# Patient Record
Sex: Female | Born: 1935
Health system: Southern US, Community
[De-identification: ages and names within clinical notes are randomized; demographics above are authoritative.]

## PROBLEM LIST (undated history)

## (undated) DIAGNOSIS — G459 Transient cerebral ischemic attack, unspecified: Secondary | ICD-10-CM

## (undated) DIAGNOSIS — E7800 Pure hypercholesterolemia, unspecified: Secondary | ICD-10-CM

## (undated) DIAGNOSIS — G309 Alzheimer's disease, unspecified: Secondary | ICD-10-CM

## (undated) DIAGNOSIS — F0280 Dementia in other diseases classified elsewhere without behavioral disturbance: Secondary | ICD-10-CM

## (undated) HISTORY — PX: KNEE SURGERY: SHX244

## (undated) HISTORY — PX: APPENDECTOMY: SHX54

---

## 2004-09-24 ENCOUNTER — Emergency Department: Payer: Self-pay | Admitting: Internal Medicine

## 2004-09-24 ENCOUNTER — Other Ambulatory Visit: Payer: Self-pay

## 2004-09-26 ENCOUNTER — Other Ambulatory Visit: Payer: Self-pay

## 2004-09-26 ENCOUNTER — Inpatient Hospital Stay: Payer: Self-pay | Admitting: Internal Medicine

## 2004-10-04 ENCOUNTER — Encounter: Payer: Self-pay | Admitting: Internal Medicine

## 2004-10-19 ENCOUNTER — Encounter: Payer: Self-pay | Admitting: Internal Medicine

## 2004-12-07 ENCOUNTER — Ambulatory Visit: Payer: Self-pay | Admitting: Unknown Physician Specialty

## 2005-03-10 ENCOUNTER — Ambulatory Visit: Payer: Self-pay | Admitting: Internal Medicine

## 2005-08-16 ENCOUNTER — Emergency Department: Payer: Self-pay | Admitting: Unknown Physician Specialty

## 2005-08-16 ENCOUNTER — Other Ambulatory Visit: Payer: Self-pay

## 2006-04-04 ENCOUNTER — Ambulatory Visit: Payer: Self-pay | Admitting: Internal Medicine

## 2006-04-20 ENCOUNTER — Inpatient Hospital Stay: Payer: Self-pay | Admitting: Internal Medicine

## 2006-04-20 ENCOUNTER — Other Ambulatory Visit: Payer: Self-pay

## 2006-05-23 ENCOUNTER — Ambulatory Visit: Payer: Self-pay | Admitting: Unknown Physician Specialty

## 2006-06-07 ENCOUNTER — Ambulatory Visit: Payer: Self-pay | Admitting: Unknown Physician Specialty

## 2006-07-12 ENCOUNTER — Ambulatory Visit: Payer: Self-pay | Admitting: Cardiology

## 2006-09-25 ENCOUNTER — Ambulatory Visit: Payer: Self-pay | Admitting: Family Medicine

## 2006-12-01 ENCOUNTER — Inpatient Hospital Stay: Payer: Self-pay | Admitting: Internal Medicine

## 2006-12-01 ENCOUNTER — Other Ambulatory Visit: Payer: Self-pay

## 2007-03-22 ENCOUNTER — Ambulatory Visit: Payer: Self-pay | Admitting: Internal Medicine

## 2008-03-25 ENCOUNTER — Ambulatory Visit: Payer: Self-pay | Admitting: Internal Medicine

## 2008-06-03 ENCOUNTER — Ambulatory Visit: Payer: Self-pay | Admitting: Unknown Physician Specialty

## 2008-12-14 ENCOUNTER — Emergency Department: Payer: Self-pay | Admitting: Emergency Medicine

## 2009-04-01 ENCOUNTER — Ambulatory Visit: Payer: Self-pay | Admitting: Internal Medicine

## 2009-05-09 ENCOUNTER — Emergency Department: Payer: Self-pay | Admitting: Emergency Medicine

## 2010-04-05 ENCOUNTER — Ambulatory Visit: Payer: Self-pay | Admitting: Internal Medicine

## 2010-05-04 ENCOUNTER — Observation Stay: Payer: Self-pay | Admitting: Specialist

## 2010-10-06 ENCOUNTER — Ambulatory Visit: Payer: Self-pay | Admitting: Internal Medicine

## 2011-04-11 ENCOUNTER — Ambulatory Visit: Payer: Self-pay | Admitting: Internal Medicine

## 2011-12-08 ENCOUNTER — Emergency Department: Payer: Self-pay | Admitting: *Deleted

## 2012-03-03 LAB — COMPREHENSIVE METABOLIC PANEL
Albumin: 3.7 g/dL (ref 3.4–5.0)
Alkaline Phosphatase: 28 U/L — ABNORMAL LOW (ref 50–136)
Anion Gap: 14 (ref 7–16)
BUN: 17 mg/dL (ref 7–18)
Bilirubin,Total: 0.9 mg/dL (ref 0.2–1.0)
Calcium, Total: 8.9 mg/dL (ref 8.5–10.1)
Creatinine: 1.04 mg/dL (ref 0.60–1.30)
EGFR (Non-African Amer.): 54 — ABNORMAL LOW
Glucose: 128 mg/dL — ABNORMAL HIGH (ref 65–99)
Osmolality: 286 (ref 275–301)
Potassium: 3.7 mmol/L (ref 3.5–5.1)
SGPT (ALT): 18 U/L
Sodium: 142 mmol/L (ref 136–145)
Total Protein: 6.6 g/dL (ref 6.4–8.2)

## 2012-03-03 LAB — CK TOTAL AND CKMB (NOT AT ARMC)
CK, Total: 122 U/L (ref 21–215)
CK-MB: 2.9 ng/mL (ref 0.5–3.6)

## 2012-03-03 LAB — CBC
HCT: 37.4 % (ref 35.0–47.0)
HGB: 12.8 g/dL (ref 12.0–16.0)
MCH: 31 pg (ref 26.0–34.0)
MCHC: 34.2 g/dL (ref 32.0–36.0)
Platelet: 149 10*3/uL — ABNORMAL LOW (ref 150–440)
RBC: 4.12 10*6/uL (ref 3.80–5.20)
RDW: 12.7 % (ref 11.5–14.5)

## 2012-03-03 LAB — TROPONIN I: Troponin-I: <0.02 ng/mL

## 2012-03-04 ENCOUNTER — Observation Stay: Payer: Self-pay | Admitting: Specialist

## 2012-03-04 LAB — CK-MB
CK-MB: 2.1 ng/mL (ref 0.5–3.6)
CK-MB: 2.6 ng/mL (ref 0.5–3.6)

## 2012-03-04 LAB — BASIC METABOLIC PANEL
Anion Gap: 10 (ref 7–16)
BUN: 14 mg/dL (ref 7–18)
Calcium, Total: 8.5 mg/dL (ref 8.5–10.1)
Chloride: 106 mmol/L (ref 98–107)
Co2: 27 mmol/L (ref 21–32)
Osmolality: 287 (ref 275–301)
Potassium: 3.8 mmol/L (ref 3.5–5.1)

## 2012-03-04 LAB — CBC WITH DIFFERENTIAL/PLATELET
Basophil #: 0 10*3/uL (ref 0.0–0.1)
Eosinophil %: 0.1 %
Lymphocyte %: 14.4 %
MCH: 31 pg (ref 26.0–34.0)
MCV: 90 fL (ref 80–100)
Monocyte %: 5 %
Platelet: 149 10*3/uL — ABNORMAL LOW (ref 150–440)
RBC: 3.91 10*6/uL (ref 3.80–5.20)
RDW: 12.6 % (ref 11.5–14.5)
WBC: 4.7 10*3/uL (ref 3.6–11.0)

## 2012-03-04 LAB — TROPONIN I: Troponin-I: <0.02 ng/mL

## 2012-03-09 LAB — CULTURE, BLOOD (SINGLE)

## 2012-05-09 ENCOUNTER — Ambulatory Visit: Payer: Self-pay | Admitting: Internal Medicine

## 2012-09-24 ENCOUNTER — Emergency Department: Payer: Self-pay | Admitting: Emergency Medicine

## 2012-09-24 ENCOUNTER — Ambulatory Visit: Payer: Self-pay | Admitting: Internal Medicine

## 2012-09-24 LAB — BASIC METABOLIC PANEL
Anion Gap: 9 (ref 7–16)
BUN: 13 mg/dL (ref 7–18)
Co2: 25 mmol/L (ref 21–32)
Creatinine: 0.98 mg/dL (ref 0.60–1.30)
EGFR (African American): >60
EGFR (Non-African Amer.): 54 — ABNORMAL LOW
Glucose: 123 mg/dL — ABNORMAL HIGH (ref 65–99)
Potassium: 4.2 mmol/L (ref 3.5–5.1)
Sodium: 137 mmol/L (ref 136–145)

## 2012-09-24 LAB — CBC WITH DIFFERENTIAL/PLATELET
Eosinophil #: 0 10*3/uL (ref 0.0–0.7)
Lymphocyte #: 0.6 10*3/uL — ABNORMAL LOW (ref 1.0–3.6)
MCH: 30.4 pg (ref 26.0–34.0)
MCHC: 34.7 g/dL (ref 32.0–36.0)
MCV: 88 fL (ref 80–100)
Monocyte #: 1.1 x10 3/mm — ABNORMAL HIGH (ref 0.2–0.9)
Neutrophil %: 81.5 %
Platelet: 193 10*3/uL (ref 150–440)
RBC: 3.84 10*6/uL (ref 3.80–5.20)

## 2012-09-24 LAB — PROTIME-INR: Prothrombin Time: 14.2 secs (ref 11.5–14.7)

## 2013-01-18 ENCOUNTER — Ambulatory Visit: Payer: Self-pay | Admitting: Internal Medicine

## 2013-11-20 ENCOUNTER — Ambulatory Visit: Payer: Self-pay | Admitting: Family Medicine

## 2014-04-05 ENCOUNTER — Emergency Department: Payer: Self-pay | Admitting: Emergency Medicine

## 2014-04-05 LAB — DIFFERENTIAL
BASOS PCT: 0.5 %
Basophil #: 0 10*3/uL (ref 0.0–0.1)
EOS PCT: 0.1 %
Eosinophil #: 0 10*3/uL (ref 0.0–0.7)
LYMPHS ABS: 0.8 10*3/uL — AB (ref 1.0–3.6)
Lymphocyte %: 31.5 %
Monocyte #: 0.2 x10 3/mm (ref 0.2–0.9)
Monocyte %: 9.7 %
NEUTROS ABS: 1.5 10*3/uL (ref 1.4–6.5)
NEUTROS PCT: 58.2 %

## 2014-04-05 LAB — BASIC METABOLIC PANEL
Anion Gap: 6 — ABNORMAL LOW (ref 7–16)
BUN: 7 mg/dL (ref 7–18)
CALCIUM: 8.9 mg/dL (ref 8.5–10.1)
CO2: 30 mmol/L (ref 21–32)
CREATININE: 0.89 mg/dL (ref 0.60–1.30)
Chloride: 107 mmol/L (ref 98–107)
EGFR (African American): >60
EGFR (Non-African Amer.): >60
Glucose: 91 mg/dL (ref 65–99)
Osmolality: 283 (ref 275–301)
Potassium: 3.7 mmol/L (ref 3.5–5.1)
Sodium: 143 mmol/L (ref 136–145)

## 2014-04-05 LAB — CBC
HCT: 38.7 % (ref 35.0–47.0)
HGB: 12.8 g/dL (ref 12.0–16.0)
MCH: 29.3 pg (ref 26.0–34.0)
MCHC: 33.1 g/dL (ref 32.0–36.0)
MCV: 89 fL (ref 80–100)
PLATELETS: 154 10*3/uL (ref 150–440)
RBC: 4.37 10*6/uL (ref 3.80–5.20)
RDW: 13.4 % (ref 11.5–14.5)
WBC: 2.6 10*3/uL — ABNORMAL LOW (ref 3.6–11.0)

## 2014-04-05 LAB — PRO B NATRIURETIC PEPTIDE: B-TYPE NATIURETIC PEPTID: 181 pg/mL (ref 0–450)

## 2014-04-05 LAB — TROPONIN I: Troponin-I: <0.02 ng/mL

## 2014-07-09 ENCOUNTER — Observation Stay: Payer: Self-pay | Admitting: Internal Medicine

## 2014-07-09 LAB — CBC WITH DIFFERENTIAL/PLATELET
BASOS ABS: 0 10*3/uL (ref 0.0–0.1)
BASOS PCT: 0.5 %
EOS ABS: 0 10*3/uL (ref 0.0–0.7)
Eosinophil %: 0.2 %
HCT: 38.4 % (ref 35.0–47.0)
HGB: 12.8 g/dL (ref 12.0–16.0)
Lymphocyte #: 1 10*3/uL (ref 1.0–3.6)
Lymphocyte %: 13.5 %
MCH: 30.2 pg (ref 26.0–34.0)
MCHC: 33.4 g/dL (ref 32.0–36.0)
MCV: 90 fL (ref 80–100)
Monocyte #: 0.4 x10 3/mm (ref 0.2–0.9)
Monocyte %: 5.7 %
NEUTROS ABS: 5.8 10*3/uL (ref 1.4–6.5)
NEUTROS PCT: 80.1 %
Platelet: 179 10*3/uL (ref 150–440)
RBC: 4.25 10*6/uL (ref 3.80–5.20)
RDW: 13.7 % (ref 11.5–14.5)
WBC: 7.3 10*3/uL (ref 3.6–11.0)

## 2014-07-09 LAB — TROPONIN I: Troponin-I: <0.02 ng/mL

## 2014-07-09 LAB — CK-MB
CK-MB: 2 ng/mL (ref 0.5–3.6)
CK-MB: 2 ng/mL (ref 0.5–3.6)
CK-MB: 2.2 ng/mL (ref 0.5–3.6)

## 2014-07-09 LAB — BASIC METABOLIC PANEL
Anion Gap: 8 (ref 7–16)
BUN: 18 mg/dL (ref 7–18)
CALCIUM: 9.1 mg/dL (ref 8.5–10.1)
CO2: 25 mmol/L (ref 21–32)
CREATININE: 1.15 mg/dL (ref 0.60–1.30)
Chloride: 108 mmol/L — ABNORMAL HIGH (ref 98–107)
GFR CALC AF AMER: 51 — AB
GFR CALC NON AF AMER: 44 — AB
Glucose: 127 mg/dL — ABNORMAL HIGH (ref 65–99)
Osmolality: 285 (ref 275–301)
POTASSIUM: 4.2 mmol/L (ref 3.5–5.1)
Sodium: 141 mmol/L (ref 136–145)

## 2014-07-09 LAB — PROTIME-INR
INR: 1
Prothrombin Time: 13 secs (ref 11.5–14.7)

## 2014-07-09 LAB — APTT: Activated PTT: 29.4 secs (ref 23.6–35.9)

## 2014-07-10 DIAGNOSIS — I369 Nonrheumatic tricuspid valve disorder, unspecified: Secondary | ICD-10-CM

## 2014-07-10 LAB — CBC WITH DIFFERENTIAL/PLATELET
Basophil #: 0 10*3/uL (ref 0.0–0.1)
Basophil %: 0.1 %
Eosinophil #: 0 10*3/uL (ref 0.0–0.7)
Eosinophil %: 0.3 %
HCT: 34.7 % — ABNORMAL LOW (ref 35.0–47.0)
HGB: 11.7 g/dL — AB (ref 12.0–16.0)
Lymphocyte #: 0.7 10*3/uL — ABNORMAL LOW (ref 1.0–3.6)
Lymphocyte %: 9.3 %
MCH: 30.7 pg (ref 26.0–34.0)
MCHC: 33.7 g/dL (ref 32.0–36.0)
MCV: 91 fL (ref 80–100)
Monocyte #: 0.7 x10 3/mm (ref 0.2–0.9)
Monocyte %: 10.1 %
NEUTROS PCT: 80.2 %
Neutrophil #: 5.6 10*3/uL (ref 1.4–6.5)
Platelet: 149 10*3/uL — ABNORMAL LOW (ref 150–440)
RBC: 3.81 10*6/uL (ref 3.80–5.20)
RDW: 13.8 % (ref 11.5–14.5)
WBC: 7 10*3/uL (ref 3.6–11.0)

## 2014-07-10 LAB — BASIC METABOLIC PANEL
ANION GAP: 8 (ref 7–16)
BUN: 12 mg/dL (ref 7–18)
CALCIUM: 8.2 mg/dL — AB (ref 8.5–10.1)
CHLORIDE: 110 mmol/L — AB (ref 98–107)
Co2: 23 mmol/L (ref 21–32)
Creatinine: 0.8 mg/dL (ref 0.60–1.30)
EGFR (African American): >60
Glucose: 128 mg/dL — ABNORMAL HIGH (ref 65–99)
Osmolality: 283 (ref 275–301)
POTASSIUM: 3.2 mmol/L — AB (ref 3.5–5.1)
SODIUM: 141 mmol/L (ref 136–145)

## 2014-07-11 LAB — CBC WITH DIFFERENTIAL/PLATELET
Basophil #: 0 10*3/uL (ref 0.0–0.1)
Basophil %: 0.3 %
EOS ABS: 0 10*3/uL (ref 0.0–0.7)
EOS PCT: 0.1 %
HCT: 31.6 % — ABNORMAL LOW (ref 35.0–47.0)
HGB: 10.7 g/dL — AB (ref 12.0–16.0)
Lymphocyte #: 0.8 10*3/uL — ABNORMAL LOW (ref 1.0–3.6)
Lymphocyte %: 10.9 %
MCH: 30.7 pg (ref 26.0–34.0)
MCHC: 33.7 g/dL (ref 32.0–36.0)
MCV: 91 fL (ref 80–100)
Monocyte #: 1.1 x10 3/mm — ABNORMAL HIGH (ref 0.2–0.9)
Monocyte %: 14.6 %
NEUTROS PCT: 74.1 %
Neutrophil #: 5.4 10*3/uL (ref 1.4–6.5)
Platelet: 111 10*3/uL — ABNORMAL LOW (ref 150–440)
RBC: 3.48 10*6/uL — ABNORMAL LOW (ref 3.80–5.20)
RDW: 13.7 % (ref 11.5–14.5)
WBC: 7.3 10*3/uL (ref 3.6–11.0)

## 2014-07-11 LAB — BASIC METABOLIC PANEL
Anion Gap: 10 (ref 7–16)
BUN: 11 mg/dL (ref 7–18)
CALCIUM: 8 mg/dL — AB (ref 8.5–10.1)
Chloride: 108 mmol/L — ABNORMAL HIGH (ref 98–107)
Co2: 22 mmol/L (ref 21–32)
Creatinine: 0.85 mg/dL (ref 0.60–1.30)
EGFR (African American): >60
EGFR (Non-African Amer.): >60
GLUCOSE: 118 mg/dL — AB (ref 65–99)
OSMOLALITY: 280 (ref 275–301)
Potassium: 3.1 mmol/L — ABNORMAL LOW (ref 3.5–5.1)
Sodium: 140 mmol/L (ref 136–145)

## 2014-10-28 ENCOUNTER — Ambulatory Visit: Payer: Self-pay | Admitting: Orthopedic Surgery

## 2014-10-28 LAB — BASIC METABOLIC PANEL
Anion Gap: 5 — ABNORMAL LOW (ref 7–16)
BUN: 16 mg/dL (ref 7–18)
CHLORIDE: 109 mmol/L — AB (ref 98–107)
CO2: 30 mmol/L (ref 21–32)
Calcium, Total: 9 mg/dL (ref 8.5–10.1)
Creatinine: 0.86 mg/dL (ref 0.60–1.30)
GLUCOSE: 94 mg/dL (ref 65–99)
OSMOLALITY: 288 (ref 275–301)
Potassium: 4.2 mmol/L (ref 3.5–5.1)
Sodium: 144 mmol/L (ref 136–145)

## 2014-10-28 LAB — URINALYSIS, COMPLETE
BACTERIA: NONE SEEN
Bilirubin,UR: NEGATIVE
Blood: NEGATIVE
Glucose,UR: NEGATIVE mg/dL (ref 0–75)
Ketone: NEGATIVE
Leukocyte Esterase: NEGATIVE
Nitrite: NEGATIVE
PROTEIN: NEGATIVE
Ph: 5 (ref 4.5–8.0)
SPECIFIC GRAVITY: 1.019 (ref 1.003–1.030)
Squamous Epithelial: 1

## 2014-10-28 LAB — CBC
HCT: 38.6 % (ref 35.0–47.0)
HGB: 12.7 g/dL (ref 12.0–16.0)
MCH: 29.9 pg (ref 26.0–34.0)
MCHC: 32.8 g/dL (ref 32.0–36.0)
MCV: 91 fL (ref 80–100)
Platelet: 197 10*3/uL (ref 150–440)
RBC: 4.25 10*6/uL (ref 3.80–5.20)
RDW: 14 % (ref 11.5–14.5)
WBC: 4.7 10*3/uL (ref 3.6–11.0)

## 2014-10-28 LAB — PROTIME-INR
INR: 1
Prothrombin Time: 13.3 secs (ref 11.5–14.7)

## 2014-10-28 LAB — APTT: Activated PTT: 29.1 secs (ref 23.6–35.9)

## 2014-11-06 ENCOUNTER — Ambulatory Visit: Payer: Self-pay | Admitting: Orthopedic Surgery

## 2014-11-25 ENCOUNTER — Ambulatory Visit: Payer: Self-pay | Admitting: Family Medicine

## 2014-12-17 ENCOUNTER — Ambulatory Visit: Payer: Self-pay | Admitting: Family Medicine

## 2015-03-06 ENCOUNTER — Emergency Department: Payer: Self-pay | Admitting: Emergency Medicine

## 2015-04-11 NOTE — Discharge Summary (Signed)
PATIENT NAME:  Cynthia Lopez, Cynthia Lopez MR#:  [id number] DATE OF BIRTH:  1929/11/27  DATE OF ADMISSION:  07/09/2014 DATE OF DISCHARGE:  07/14/2014  ADDENDUM:  Please see previously dictated discharge summary by Dr. Gladstone Lighter on July 26. The patient was waiting for placement only. No new changes in the medications or course had been there. On the date of discharge, she was stable with vital signs as follows: Temperature 98, heart rate 72 per minute, respirations 18 per minute, blood pressure 126/69, and she was saturating 96% on room air.   PERTINENT PHYSICAL EXAMINATION ON DISCHARGE:  CARDIOVASCULAR: S1, S2 normal. No murmurs, rubs, or gallop.  LUNGS: Clear to auscultation bilaterally. No wheezing, rales, rhonchi, or crepitation.  ABDOMEN: Soft, benign.  NEUROLOGIC: Nonfocal examination.   All other physical examination remained at baseline.   TOTAL TIME DISCHARGING THIS PATIENT: 40 minutes.   NOTE:  Please send with previously dictated discharge summary to the doctors below.    ____________________________ Lucina Mellow. Manuella Ghazi, MD vss:ts D: 07/16/2014 18:36:09 ET T: 07/16/2014 20:51:33 ET JOB#: [id number]  cc: Marya Lowden S. Manuella Ghazi, MD, <Dictator> Gladstone Lighter, MD Derinda Late, MD Timoteo Gaul, MD  Remer Macho MD ELECTRONICALLY SIGNED 07/17/2014 19:49

## 2015-04-11 NOTE — Op Note (Signed)
PATIENT NAME:  Cynthia Lopez, Cynthia Lopez MR#:  [id number] DATE OF BIRTH:  03-Jan-1929  DATE OF PROCEDURE:  11/06/2014  PREOPERATIVE DIAGNOSIS: Painful hardware right knee.   POSTOPERATIVE DIAGNOSIS: Painful hardware right knee.   PROCEDURE: Open removal of hardware from right knee patella.   SURGEON: Thornton Park, M.D.  ANESTHESIA: General.   ESTIMATED BLOOD LOSS: Minimal.   COMPLICATIONS: None.   INDICATION FOR THE PROCEDURE: The patient is an 81 year old female who had undergone an open reduction internal fixation with a figure-of-eight construct for a patella fracture on July 10, 2014. Her fracture has healed by x-ray, but the K wires have become prominent and painful. The decision was made to remove the hardware as they were potentially going to penetrate through her skin spontaneously if left in place.   DESCRIPTION OF PROCEDURE: The patient was met in the preoperative area. I signed the right knee according to the hospital's right site protocol. I updated her history and physical. She was brought to the operating room where she was placed supine on the operative table. She underwent general anesthesia. She was prepped and draped in a sterile fashion. A timeout was performed verifying the patient's name, date of birth, medical record number, correct site of surgery and correct procedure to be performed. It was also used to verify the patient had received antibiotics and that all appropriate instruments and radiographic studies were available in the room. Once all in attendance were in agreement, the case began.   The top of the prominent K wires were easily palpable through the skin. Through the initial incision a small approximately 1.5 cm incision was made. Through this the straight K wires were easily identified and removed with a heavy needle driver. Next, a second 1.5 cm incision was made at the center of the patella where the 18 gauge wire was crossing. The wires were easily identified. The  wires were then cut and pulled out easily again using a heavy needle driver. FluoroScan images were taken at the conclusion of the case to confirm that all hardware was removed and that the fracture remained well reduced. The wounds were copiously irrigated. She had 2-0 Vicryl to close the dermal layers of skin and the skin was approximated with staples. A dry sterile dressing was applied. The patient was then awakened and brought to the PACU in stable condition. I was scrubbed and present for the entire case and all sharp and instrument counts were correct at the conclusion of the case. I spoke with the patient's family postoperatively to let them know the case had gone without complication, and the patient was stable in the recovery room. She may be discharged home later today.  ____________________________ Timoteo Gaul, MD klk:sb D: 11/11/2014 09:57:30 ET T: 11/11/2014 10:22:16 ET JOB#: [id number]  cc: Timoteo Gaul, MD, <Dictator> Timoteo Gaul MD ELECTRONICALLY SIGNED 11/14/2014 15:39

## 2015-04-11 NOTE — H&P (Signed)
PATIENT NAME:  Cynthia Lopez, Cynthia Lopez MR#:  [id number] DATE OF BIRTH:  11-Mar-1929  DATE OF ADMISSION:  07/09/2014  ADMITTING PHYSICIAN: Gladstone Lighter, MD  PRIMARY CARE PHYSICIAN: Derinda Late, MD   CHIEF COMPLAINT: Passing out and right knee pain.   HISTORY OF PRESENT ILLNESS: Cynthia Lopez is a very pleasant 81 year old Caucasian female with past medical history significant for history of CVA/TIA, with no residual neurological deficits at this time, history of mild dementia, gastroesophageal reflux disease, history of lower extremity DVT in October 2013, finished treatment with Xarelto at this time, who came in after she fell at her home and had severe right knee pain. The patient is diagnosed with right patellar fracture without dislocation and is being admitted for further treatment. Because of her dementia, the patient is unable to provide great history at this time. She said she woke up fine. She was getting down her steps this morning and fell down, felt as if her legs gave out, but she is not sure if she passed out for a minute or not. Denies any chest pain, lightheadedness, dizziness or any palpitations. According to PCP's notes, the patient had a Myoview and cardiac catheterization in 2007, both of which seemed to be negative at the time. The patient does not seem like she is having any cardiac symptoms. Her only medications at home are aspirin, omeprazole and Aricept for her dementia and has been doing fine. She usually uses a cane, but not sure if she took her cane this morning.   PAST MEDICAL HISTORY:  1. History of chest pain syndrome, which was noncardiac. All the cardiac tests in 2007 were negative for ischemia.  2. History of skin cancer.  3. History of complicated migraines.  4. History of TIA/CVA, with no residual neurological deficits.  5. Osteoporosis.  6. Gastroesophageal reflux disease.  7. Diet-controlled hyperlipidemia.  8. Osteoarthritis.  9. Alzheimer's dementia.  10. History  of left lower extremity DVT in 2013, finished Xarelto.   PAST SURGICAL HISTORY:  1. Tonsillectomy.  2. Appendectomy.  3. Liver biopsy for possible hepatitis.  4. Polypectomy.  5. Hysterectomy.   ALLERGIES TO MEDICATIONS: PENICILLIN, ACIPHEX CAUSING CHEST PAIN. SULFA ANTIBIOTICS.   CURRENT HOME MEDICATIONS:  1. Aricept 10 mg p.o. daily.  2. Aspirin 81 mg p.o. daily.  3. Omeprazole 20 mg p.o. b.i.d.   SOCIAL HISTORY: Lives at home with her husband. Uses a cane to ambulate sometimes. No smoking or alcohol use.   FAMILY HISTORY: Not significant at this time.   REVIEW OF SYSTEMS: Unable to obtain secondary to the patient's dementia.   PHYSICAL EXAMINATION: VITAL SIGNS: Temperature 98.1 degrees Fahrenheit, pulse 58, respirations 18, blood pressure 117/51, pulse oximetry 100% on room air.  GENERAL: Well-built, well-nourished female lying in bed, not in any acute distress.  HEENT: Normocephalic, atraumatic. Pupils are equal, round and reacting to light. Anicteric sclerae. Extraocular movements intact. Oropharynx clear, without erythema, mass or exudates.  NECK: Supple. No thyromegaly, JVD or carotid bruits. No lymphadenopathy.  LUNGS: Moving air bilaterally. No wheeze or crackles. No use of accessory muscles for breathing.  CARDIOVASCULAR: S1, S2, regular rate and rhythm. No murmurs, rubs or gallops.  ABDOMEN: Soft, nontender, nondistended. No hepatosplenomegaly. Normal bowel sounds.  EXTREMITIES: No pedal edema. No clubbing or cyanosis, 2+ dorsalis pedis pulses palpable bilaterally. Her right knee is swollen, ecchymotic and very tender to touch from the trauma.  SKIN: No acne, rash or lesions.  LYMPHATICS: No cervical lymphadenopathy.  NEUROLOGICAL: Cranial nerves are  grossly intact. No new focal motor or sensory deficits.  PSYCHOLOGICAL: The patient is awake, alert, oriented to self at this time.   LABORATORY DATA:  WBC 7.3, hemoglobin 12.8, hematocrit 38.4, platelet count 179.   Sodium 141, potassium 4.2, chloride 108, bicarbonate 25, BUN 18, creatinine 1.15, glucose 127 and calcium of 9.1.  Troponin less than 0.02.  INR is 1.0.   IMAGING:  X-ray of the right knee shows impacted displaced patellar fracture without dislocation. There is a lot of soft tissue swelling with moderate suprapatellar joint effusion.  CT of the head without contrast showing no acute intracranial hemorrhage or other acute intracranial abnormality. Age-appropriate atrophy changes. No skull fracture noted.   ELECTROCARDIOGRAM: Showing some old changes, but normal sinus rhythm. No acute ST-T wave abnormalities noted.   ASSESSMENT AND PLAN: An 81 year old female with history of dementia, gastroesophageal reflux disease, history of deep vein thrombosis, currently not on any anticoagulation, comes after a fall and right patellar fracture.   1. Syncope, not clear if the patient had real syncope or mechanical fall. Monitor on telemetry. Troponin is negative so far. No acute EKG changes. She had extensive cardiac workup done in the past which was negative. However, check echo and carotid Dopplers for the same and physical therapy at discharge.  2. Right patellar fracture from fall. Follow up with ortho consult. Ice pack for now. Pain medications. Low cardiac risk for surgery if they have to proceed. The patient not on Xarelto or any other anticoagulation at this time based on her PCP's notes.  4. History of dementia, currently at baseline. Continue Aricept.  5. Gastroesophageal reflux disease, on Protonix.  6. Deep vein thrombosis prophylaxis with TEDs and SCDs for possible surgery tomorrow.   CODE STATUS: Full code.   TIME SPENT ON ADMISSION: 50 minutes.   ____________________________ Gladstone Lighter, MD rk:lb D: 07/09/2014 12:59:47 ET T: 07/09/2014 13:33:49 ET JOB#: [id number]  cc: Gladstone Lighter, MD, <Dictator> Derinda Late, MD Gladstone Lighter MD ELECTRONICALLY SIGNED 07/16/2014  15:06

## 2015-04-11 NOTE — Consult Note (Signed)
Brief Consult Note: Diagnosis: Right displaced patella fracture.   Patient was seen by consultant.   Consult note dictated.   Orders entered.   Comments: Patient is 81 year old with dementia who fell at home at has a displaced fracture to her patella.  I have explained to the patient and her family options for treament including operative and nonoperative.  The risks and benefits of surgical intervention were discussed in detail with the patient and her family and they  expressed understanding of the risks and benefits and agreed with plans for surgery. Patient will be NPO after midnight.  Patient is an add-on case for tomorrow.  She has been medically cleared for surgery.  Electronic Signatures: Thornton Park (MD)  (Signed 22-Jul-15 21:32)  Authored: Brief Consult Note   Last Updated: 22-Jul-15 21:32 by Thornton Park (MD)

## 2015-04-11 NOTE — Discharge Summary (Signed)
PATIENT NAME:  Cynthia Lopez, KNOCHE MR#:  [id number] DATE OF BIRTH:  04-13-1929  DATE OF ADMISSION:  07/09/2014 DATE OF DISCHARGE:    ADMITTING PHYSICIAN: Dr. Gladstone Lighter.  PRIMARY CARE PHYSICIAN: Dr. Baldemar Lenis.  CONSULTATIONS IN THE HOSPITAL: Orthopedic consultation by Dr. Mack Guise.  DATE OF DISCHARGE: 07/14/2014.   DISCHARGING PHYSICIAN: Dr. Gladstone Lighter.  DISCHARGE DIAGNOSES:  1. Vasovagal syncope.  2. Right patella fracture status post operative reduction internal fixation on 07/10/2014.  3. Dementia.  4. Gastroesophageal reflux disease.   SECONDARY DIAGNOSES: 1. A history of chest pain syndrome, which is noncardiac. All cardiac workup negative.  2. History of skin cancer.  3. History of complicated migraines.  4. History of transient ischemic attack with no residual neurological deficits.  5. Osteoporosis.  6. Diet-controlled hyperlipidemia.  7. Alzheimer dementia.  8. Lower extremity deep vein thrombosis in the past, finished Xarelto treatment.  DISCHARGE HOME MEDICATIONS:  1. Aspirin 81 mg p.o. daily.  2. Omeprazole 20 mg p.o. b.i.d.  3. Donepezil 10 mg p.o. at bedtime.  4. Tylenol 650 mg q. 4 hours p.r.n. for pain.  5. Norco 5/325 mg q. 6 hours p.r.n. for pain. 6. Lovenox 30 mg subcutaneous q. 12 hours for 12 days.  7. Colace 100 mg p.o. b.i.d.  8. Magnesium hydroxide 30 mL twice a day as needed for constipation.  9. Ambien 5 mg p.o. at bedtime p.r.n. for sleep.   DISCHARGE DIET: Regular diet.    DISCHARGE ACTIVITY: As tolerated.   FOLLOWUP:  1. Orthopedic followup in 10 days.  2. PCP followup in 1-2 weeks.  3. Physical therapy.   LABORATORIES AND IMAGING STUDIES PRIOR TO DISCHARGE: WBC 7.3, hemoglobin 10.7, hematocrit 31.6, platelet count 111,000. Sodium 140, potassium 3.1, chloride 108, bicarbonate 22, BUN 11, creatinine 0.85, glucose 118, and calcium of 8.0.  Knee x-ray postoperatively showing right patellar fracture, which is fixed. Echo Doppler done  for workup of syncope showing normal LV ejection fraction EF of 60% to 65%.  Ultrasound carotids showing no hemodynamically significant stenosis.   BRIEF HOSPITAL COURSE: Ms. Leaf is an 81 year old elderly Caucasian female with past medical history significant for dementia, gastroesophageal reflux disease, osteoporosis, who presents to the hospital after a fall.  1. Syncope secondary to vasovagal in nature. The patient had a fall onto all her fours,  which was mechanical, initially hurt her knee and then she started walking up with that hurt knee and pain could have triggered the vasovagal episode and she passed out for a few minutes. She was noted to have patellar fracture.  2. Vasovagal syncope. Cardiac workup was negative. Echo and carotid Dopplers were normal. She has not had further syncopal episodes in the hospital.  3. Right patellar fracture. Appreciate ortho input. Had open reduction and internal fixation done on 07/10/2014. Postoperatively she is working well with physical therapy and is ready for discharge to rehab at this point. Pain medications can be used as needed for pain. She is on Lovenox for deep venous thrombosis prophylaxis for 2 weeks. Finish 3 days in the hospital. Will need another 11 days. She is ordered to follow up as an outpatient.  4. Dementia. Patient on donepezil.  5. Gastroesophageal reflux disease. The patient on Prilosec.  Her course has been otherwise uneventful in the hospital.   DISCHARGE CONDITION: Stable.   DISCHARGE DISPOSITION: Short-term rehab.   TIME SPENT ON DISCHARGE: 40 minutes.   CODE STATUS: Full code.    ____________________________ Gladstone Lighter, MD rk:lt D: 07/13/2014  12:56:35 ET T: 07/13/2014 23:34:58 ET JOB#: [id number]  cc: Gladstone Lighter, MD, <Dictator> Derinda Late, MD Timoteo Gaul, MD Gladstone Lighter MD ELECTRONICALLY SIGNED 07/16/2014 15:07

## 2015-04-11 NOTE — Consult Note (Signed)
PATIENT NAME:  Cynthia Lopez, Cynthia Lopez MR#:  [id number] DATE OF BIRTH:  February 12, 1929  DATE OF CONSULTATION:  07/09/2014  CONSULTING PHYSICIAN:  Timoteo Gaul, MD  PRINCIPAL DIAGNOSIS: Right patella fracture.  HISTORY OF PRESENT ILLNESS:  Cynthia Lopez is an 81 year old female who sustained a fall at home. She landed on her right knee. She is seen in her hospital room today with her family. They report 2 episodes of syncope, causing her to fall last night. The patient has dementia and the history is mainly obtained from her daughters. The patient is currently comfortable lying in her hospital bed.   PAST MEDICAL HISTORY: Chest pain syndrome which had a negative cardiac workup in  2007 for ischemia, history of skin cancer, history of migraines, history of TIA/CVA with no residual neurologic deficits, osteoporosis, GERD, hyperlipidemia, osteoarthritis, Alzheimer's dementia and a history of left lower extremity DVT in 2013 treated with Xarelto.   PAST SURGICAL HISTORY: Tonsillectomy, appendectomy, liver biopsy, polypectomy and hysterectomy.   ALLERGIES: PENICILLIN, ACIPHEX, AND SULFA.   CURRENT HOME MEDICATIONS:  Aricept 10 mg daily, aspirin 81 mg daily and omeprazole 20 mg p.o. b.i.d.   SOCIAL HISTORY: The patient lives at home with her husband. She uses a cane for ambulation at baseline. She denies any smoking or alcohol use.   PHYSICAL EXAMINATION:  EXTREMITIES:  Right knee: The patient's skin is intact. She has swelling over the right knee with mild erythema and ecchymosis. The patient distally is neurovascularly intact. She has palpable pedal pulses. Her leg and thigh compartments are soft and compressible. She can flex and extend all toes and dorsiflex and plantarflex her ankle. She has intact sensation in both lower extremities.   RADIOLOGY: X-ray films of the right knee show a transverse fracture of the mid patella with a flexion deformity of the proximal fragment. There is approximately a centimeter  displacement. No fracture of the distal femur or tibia as noted.   ASSESSMENT: Right displaced patella fracture.   PLAN: I reviewed the  injury with the patient and her family. I also discussed treatment options, which included nonsurgical treatment in an immobilizer, which will likely lead to malunion, as well as surgical treatment. I discussed the risks and benefits of surgery as well. The family understands the risks include infection, bleeding requiring blood transfusion, nerve or blood vessel injury, wound healing problems, knee stiffness, failure of the hardware or painful hardware, malunion, nonunion, osteoarthritis, failure to return to ambulation and the need for further surgery. Medical complications may include but are not limited to, DVT and pulmonary embolism, myocardial infarction, stroke, pneumonia, respiratory failure and death. The patient and her family were interested in performing surgery. They will sign the consent form at their earliest convenience. The patient has been added to the operative schedule tomorrow. I have reviewed all laboratory studies and her knee films in preparation for this surgery. She has been cleared by the hospitalist service. She is n.p.o. after midnight, except for her medications.    ____________________________ Timoteo Gaul, MD klk:ds D: 07/09/2014 22:21:15 ET T: 07/09/2014 22:49:17 ET JOB#: [id number]  cc: Timoteo Gaul, MD, <Dictator> Timoteo Gaul MD ELECTRONICALLY SIGNED 07/23/2014 11:03

## 2015-04-11 NOTE — Op Note (Signed)
PATIENT NAME:  Cynthia Lopez, Cynthia Lopez MR#:  [id number] DATE OF BIRTH:  March 26, 1929  DATE OF PROCEDURE:  07/10/2014  PREOPERATIVE DIAGNOSIS: Right knee patellar fracture.   POSTOPERATIVE DIAGNOSIS: Right knee patellar fracture.  PROCEDURE PERFORMED: Open reduction and internal fixation, right displaced patella fracture.   ANESTHESIA: Spinal.   SURGEON: Thornton Park, M.D.   ASSISTANT: Chase Picket, PA-C   ESTIMATED BLOOD LOSS: Minimal.   COMPLICATIONS: None.   INDICATIONS FOR PROCEDURE: The patient is an 81 year old female who is an ambulator at baseline. She has displaced transverse fracture of the midline of the patella. There is significant incongruity at the joint line. I reviewed the different treatment options, including nonoperative management versus surgical management. The patient opted for surgical treatment, understanding the risks include infection, bleeding, nerve or blood vessel injury, knee stiffness, hardware breakage, malunion, nonunion, persistent pain and the need for further surgery. Medical risks include deep vein thrombosis and pulmonary embolism, myocardial infarction, stroke, pneumonia, respiratory failure and death. The patient's family understood these risks and wished to proceed. The patient was cleared by the medical service. She was then brought to the Operating Room on 07/10/2014.   DESCRIPTION OF PROCEDURE: The patient had her right knee marked with my initials, according to the hospital's right site protocol. This was confirmed with the hospital EMR and the radiographic studies. The patient was brought to the Operating Room, where she underwent a spinal anesthetic. She was then prepped and draped in a sterile fashion. A tourniquet was applied to the right upper thigh. A timeout was performed to verify the patient's name, date of birth, medical record number, correct site of surgery and correct procedure to be performed. It was also used to verify the patient had received  antibiotics and that all appropriate instruments, implants and radiographic studies were available in the room. Once all in attendance were in agreement, the case began.   The patient had her right lower extremity Esmarched. The tourniquet was inflated to 275 mmHg. It was inflated for approximately 70 minutes. A midline incision was then made with a #15 blade. A deep blade was used to incise through the bursa. The fracture was then easily identified. The hematoma was evacuated with suction. The joint was copiously irrigated. The retinaculum on either side of the patella had been slightly torn. There was a small comminution on either side of the patella which was nondisplaced. The patella was then manually reduced. Two K wires were then sent from superior to inferior in a parallel fashion. Their position was confirmed on AP and lateral C-arm images.   The undersurface of the kneecap was then palpated for step-off. Once fracture was well reduced with parallel pins, an 18-gauge malleable wire was the wrapped in a figure-of-eight position under the parallel K wires, on both the distal and proximal end, crossing over the anterior patella. This wire was then tightened down to prevent displacement on the tension side of the fracture. The straight wires were then backed up slowly. They were then bent approximately 160 degrees. They were then rotated 180 degrees. A 15 blade was used to make small vertical incisions in line with the quadriceps tendon to allow for advancement of the K wires. The curved ends of the wire were deep and engaged the malleable wire to prevent displacement.   Final C-arm images of the figure-of-eight construct were taken in both the AP and lateral projections. The knee joint was then copiously irrigated, along with the surgical wound. The retinaculum was repaired  using #2 Ti-Cron. The deep tissue was closed with 0 Vicryl, the subcutaneous tissue closed with 2-0 Vicryl and the skin closed with  staples. A dry sterile and compressive dressing was applied to the right knee. The patient was placed in a knee immobilizer. She was then transferred to a hospital bed and brought to the PACU in stable condition.   I was scrubbed and present for the entire case, and all sharp and instrument counts were correct at the conclusion of the case. I spoke with the patient's family in the postoperative consultation area to let them know the case had gone without complication. The patient was stable in the recovery room.    ____________________________ Timoteo Gaul, MD klk:cg D: 07/14/2014 01:31:38 ET T: 07/14/2014 02:01:59 ET JOB#: [id number]  cc: Timoteo Gaul, MD, <Dictator> Timoteo Gaul MD ELECTRONICALLY SIGNED 07/23/2014 11:04

## 2015-04-12 NOTE — Discharge Summary (Signed)
PATIENT NAME:  Cynthia Lopez, Cynthia Lopez MR#:  [id number] DATE OF BIRTH:  1929/08/23  DATE OF ADMISSION:  03/04/2012 DATE OF DISCHARGE:  03/04/2012  Please see History and Physical done by Dr. Stevenson Clinch  DIAGNOSES AT DISCHARGE:  1. Dizziness, possibly related to increasing cerumen deposits in both ears.  2. History of previous transient ischemic attack.  3. Gastroesophageal reflux disease.   DIET: The patient is being discharged on a regular diet.   ACTIVITY: As tolerated.   FOLLOWUP:   Follow up with Dr. Clemmie Krill in 1 to 2 weeks   DISCHARGE MEDICATIONS:  1. Aspirin 81 mg daily.  2. Multivitamin daily.  3. Colace 50 mg one cap b.i.d.   The patient likely needs a referral to an ENT specialist to have her ears cleaned out.   PERTINENT STUDIES: CT scan of the head done without contrast showing no acute intracranial process. The patient also had an MRI of the brain done without contrast showing no acute intracranial pathology, and also an MRA of the brain showing no evidence of any acute intracranial abnormality. The patient also underwent a carotid duplex which showed no evidence of hemodynamically significant carotid stenosis with antegrade flow in both vertebrals.   BRIEF HOSPITAL COURSE: This 81 year old female with past medical history as mentioned above presented to the hospital with dizziness.   PROBLEM 1:  Dizziness.  Initially since the patient has a history of previous transient ischemic attack, her dizziness was thought to be possibly related to transient ischemic attack. She underwent an extensive work-up including carotid duplex, MRI, and MRA of the brain and a CT of the head which were negative. She also had orthostatic checks which were negative. She was given one dose of meclizine during the hospital course, although it did not alleviate her symptoms. Presently, she still remains asymptomatic. On examining her ears, she was noted to have significant about of cerumen deposits in both her ears  specifically on her right ear, which is where she had some pain a few weeks ago. She likely needs an ENT evaluation and likely these cerumen deposits removed. This likely can be done by her primary care physician, also as an outpatient. The patient presently is hemodynamically stable and therefore being discharged home.   PROBLEM 2:  History of previous transient ischemic attacks. She will continue aspirin. As mentioned, I do not think she had a transient ischemic attack during this hospitalization.   CODE STATUS: The patient is a full code.   TIME SPENT: 35 minutes.   ____________________________ Belia Heman. Verdell Carmine, MD vjs:tbf D: 03/04/2012 16:21:00 ET T: 03/06/2012 09:26:14 ET JOB#: [id number]  cc: Belia Heman. Verdell Carmine, MD, <Dictator> Valetta Close, MD Henreitta Leber MD ELECTRONICALLY SIGNED 03/07/2012 13:37

## 2015-04-12 NOTE — H&P (Signed)
PATIENT NAME:  Cynthia Lopez, Cynthia Lopez MR#:  [id number] DATE OF BIRTH:  1929-03-11  DATE OF ADMISSION:  03/03/2012  INDICATION FOR ADMISSION: Dizziness with nausea and vomiting.   CHIEF COMPLAINT: Dizziness, nausea and vomiting.   ED REFERRING PHYSICIAN: Dr. Ulice Brilliant.   PRIMARY CARE PHYSICIAN:  Dr. Clemmie Krill.   HISTORY OF PRESENT ILLNESS:  This is an 81 year old female who has past medical history significant for transient ischemic attack, complex migraines, hyperlipidemia, hypertension, remote history of dementia, stable, who presented with the above chief complaint. The patient is accompanied to the ED with her daughter who provided some of the history also. The patient states that this evening while she was doing a puzzle with her daughter she started to feel dizzy all of a sudden accompanied with some right-sided headache and nausea. At that time she felt weak. Her daughter called EMS and she was brought over to the ED. In the ED she was noted to have five episodes of non-bilious vomiting associated with nausea. She was given antiemetic that helped with the nausea and vomiting. She states that she has never had dizziness of this nature before. She does have a history of some mild dizziness in the past. She stated that today when the dizziness came on, she tried to stand up which made it worse and currently when standing up or sitting upright fully, the dizziness is worse also. She did have a CT of the head done and did not show any acute intracranial abnormalities. Hospitalist services were consulted for admission and workup.   PAST MEDICAL HISTORY:   1. Dementia. 2. Hyperlipidemia.  3. History of transient ischemic attack.  4. History of complex migraine. 5. History of skin cancer. 6. Gastroesophageal reflux disease. 7. Osteoporosis.   PAST SURGICAL HISTORY:   1. Tonsillectomy. 2. Appendectomy.  3. Liver biopsy.  4. Colonoscopy with polypectomy.   ALLERGIES: Sulfa, Penicillin.   CURRENT  MEDICATIONS: 1. Over the counter herbal vitamins.  2. Baby aspirin. 3. Dulcolax 100 milligrams one tab p.o. twice daily.   SOCIAL HISTORY: Does not smoke, does not drink. Lives with her husband.   FAMILY HISTORY:  Has a history significant for coronary artery disease and Alzheimer's dementia.   REVIEW OF SYSTEMS:  CONSTITUTIONAL: No fever or chills. EYES: No blurred vision or double vision. ENT: No hearing loss but the patient did admit to ringing in the right ear and she says possibly in the left ear. CARDIOVASCULAR: No chest pain or shortness of breath. PULMONARY: No shortness of breath. No wheezing. GASTROINTESTINAL: Positive nausea and vomiting. No diarrhea. GU: No dysuria. ENDOCRINE: No heat or cold intolerance. INTEGUMENT: No rash. MUSCULOSKELETAL: Occasional joint pain. NEUROLOGIC: She has history of mild dementia. Otherwise, no other deficits. PSYCH: No anxiety, insomnia, ADD, OCD, bipolar or depression.   PHYSICAL EXAMINATION:    VITAL SIGNS: Temperature 97.9, blood pressure 111/56, pulse 59, respirations 16, 02 sats 99% on room air.   GENERAL: This is a well nourished white female lying in bed in no acute distress.   HEENT: Pupils are equal, round and reactive to light. Mild left-sided nystagmus. Sclera anicteric. Oral mucosa is moist. Oropharynx is clear. Nasopharynx is clear. Inspection of the ears could not visualize the tympanic membranes secondary to cerumen impaction.   NECK: Supple. No jugular venous distention. No lymphadenopathy or thyromegaly.   HEART: Regular rate and rhythm. No murmurs, rubs, or gallops.   LUNGS: Clear to auscultation bilaterally. No dullness to percussion.    ABDOMEN: Soft, nondistended, nontender.  Positive bowel sounds. No hepatosplenomegaly or masses.   EXTREMITIES: There is no edema. No joint deformity.   NEUROLOGIC: Cranial nerves II-XII are intact. She is alert and oriented to person, time and place. Strength is 5/5 bilaterally in upper and  lower extremities. There is no decrease in sensation.   LABORATORY, RADIOLOGICAL AND DIAGNOSTIC DATA: CT scan of the head showed no evidence of mass effect, midline shift or extra-axial fluid collection. No evidence of a space occupying lesion or intracranial hemorrhage. There is no evidence of cortical based areas of acute infarction. There is generalized cerebral atrophy. The ventricles and sulci are appropriate for the patient's age. The basal cisterns are patent. The visualized portions of the orbits are unremarkable. The visualized portions of the paranasal sinus and mastoid air cells are unremarkable. The osseous structures are unremarkable. There is no acute intracranial process. EKG showed pretty much a normal sinus rhythm. No acute ST-T wave changes, rate of about 65. Sodium 142, potassium 3.7, chloride 104, bicarb 24, BUN 17, creatinine 1.04, glucose 128. Calcium 8.9, troponin less than 0.02. CK MB 2.9. Total CK 122. WBC 4.1, hemoglobin 12.8, hematocrit 37.4, platelet count 149.   ASSESSMENT AND PLAN:   81 year old female with past medical history of hypertension, hyperlipidemia, history of transient ischemic attack, complex migraine who presented with dizziness, nausea and vomiting.  1. Dizziness and vomiting. Differential diagnoses include new onset vertigo, probably peripheral, given the history of vertigo worse with movement and standing. The patient states that she feels as if she is spinning and not the room spinning. CT of the head is currently negative. We will start the patient on meclizine. Will check a cardiac echo, MRI/MRA and bilateral carotid ultrasounds. Check urinalysis, blood cultures. The dizziness is associated with headache and nausea. Will again check MRI/MRA ruling out any type of AVMs or subtle malformations.   2. Hypertension, currently normotensive. The patient is currently following up with her primary care physician who does not have her on any antihypertensive medication  at this time.  3. Hyperlipidemia. The patient states that it is stable. She is off meds for now. 4. History of migraine/transient ischemic attack. She is only on aspirin 81 milligrams per her primary medical doctor. We will continue this for now.  5. Gastrointestinal and deep venous thrombosis prophylaxis. Continue PPI and heparin.   CODE STATUS: FULL CODE.   TIME SPENT DICTATING AND EVALUATING THE PATIENT: 45 minutes.    ____________________________ Vilinda Boehringer, MD vm:ap D: 03/03/2012 23:12:44 ET T: 03/04/2012 08:36:23 ET JOB#: [id number]  cc: Vilinda Boehringer, MD, <Dictator> Valetta Close, MD Vilinda Boehringer MD ELECTRONICALLY SIGNED 03/04/2012 22:16

## 2016-04-25 DIAGNOSIS — N183 Chronic kidney disease, stage 3 (moderate): Secondary | ICD-10-CM | POA: Diagnosis not present

## 2016-04-25 DIAGNOSIS — E7800 Pure hypercholesterolemia, unspecified: Secondary | ICD-10-CM | POA: Diagnosis not present

## 2016-05-02 DIAGNOSIS — K219 Gastro-esophageal reflux disease without esophagitis: Secondary | ICD-10-CM | POA: Diagnosis not present

## 2016-05-02 DIAGNOSIS — E7800 Pure hypercholesterolemia, unspecified: Secondary | ICD-10-CM | POA: Diagnosis not present

## 2016-05-02 DIAGNOSIS — G301 Alzheimer's disease with late onset: Secondary | ICD-10-CM | POA: Diagnosis not present

## 2016-05-02 DIAGNOSIS — Z79899 Other long term (current) drug therapy: Secondary | ICD-10-CM | POA: Diagnosis not present

## 2016-05-02 DIAGNOSIS — I679 Cerebrovascular disease, unspecified: Secondary | ICD-10-CM | POA: Diagnosis not present

## 2016-05-02 DIAGNOSIS — F0280 Dementia in other diseases classified elsewhere without behavioral disturbance: Secondary | ICD-10-CM | POA: Diagnosis not present

## 2016-09-15 DIAGNOSIS — Z23 Encounter for immunization: Secondary | ICD-10-CM | POA: Diagnosis not present

## 2016-10-28 DIAGNOSIS — Z79899 Other long term (current) drug therapy: Secondary | ICD-10-CM | POA: Diagnosis not present

## 2016-10-28 DIAGNOSIS — E7800 Pure hypercholesterolemia, unspecified: Secondary | ICD-10-CM | POA: Diagnosis not present

## 2016-11-04 DIAGNOSIS — Z0000 Encounter for general adult medical examination without abnormal findings: Secondary | ICD-10-CM | POA: Diagnosis not present

## 2017-04-02 DIAGNOSIS — K219 Gastro-esophageal reflux disease without esophagitis: Secondary | ICD-10-CM | POA: Diagnosis not present

## 2017-04-02 DIAGNOSIS — R918 Other nonspecific abnormal finding of lung field: Secondary | ICD-10-CM | POA: Diagnosis not present

## 2017-04-02 DIAGNOSIS — R2689 Other abnormalities of gait and mobility: Secondary | ICD-10-CM | POA: Diagnosis not present

## 2017-04-02 DIAGNOSIS — M810 Age-related osteoporosis without current pathological fracture: Secondary | ICD-10-CM | POA: Diagnosis not present

## 2017-04-02 DIAGNOSIS — E782 Mixed hyperlipidemia: Secondary | ICD-10-CM | POA: Diagnosis not present

## 2017-04-02 DIAGNOSIS — Z7982 Long term (current) use of aspirin: Secondary | ICD-10-CM | POA: Diagnosis not present

## 2017-04-02 DIAGNOSIS — G459 Transient cerebral ischemic attack, unspecified: Secondary | ICD-10-CM | POA: Diagnosis not present

## 2017-04-02 DIAGNOSIS — G301 Alzheimer's disease with late onset: Secondary | ICD-10-CM | POA: Diagnosis not present

## 2017-04-02 DIAGNOSIS — Z86718 Personal history of other venous thrombosis and embolism: Secondary | ICD-10-CM | POA: Diagnosis not present

## 2017-04-02 DIAGNOSIS — R29810 Facial weakness: Secondary | ICD-10-CM | POA: Diagnosis not present

## 2017-04-02 DIAGNOSIS — R2681 Unsteadiness on feet: Secondary | ICD-10-CM | POA: Diagnosis not present

## 2017-04-02 DIAGNOSIS — R270 Ataxia, unspecified: Secondary | ICD-10-CM | POA: Diagnosis not present

## 2017-04-02 DIAGNOSIS — F0280 Dementia in other diseases classified elsewhere without behavioral disturbance: Secondary | ICD-10-CM | POA: Diagnosis not present

## 2017-04-02 DIAGNOSIS — Z8673 Personal history of transient ischemic attack (TIA), and cerebral infarction without residual deficits: Secondary | ICD-10-CM | POA: Diagnosis not present

## 2017-04-03 DIAGNOSIS — R2681 Unsteadiness on feet: Secondary | ICD-10-CM | POA: Diagnosis not present

## 2017-04-03 DIAGNOSIS — R29810 Facial weakness: Secondary | ICD-10-CM | POA: Diagnosis not present

## 2017-04-28 DIAGNOSIS — Z79899 Other long term (current) drug therapy: Secondary | ICD-10-CM | POA: Diagnosis not present

## 2017-05-02 DIAGNOSIS — R29810 Facial weakness: Secondary | ICD-10-CM | POA: Diagnosis not present

## 2017-05-05 DIAGNOSIS — Z79899 Other long term (current) drug therapy: Secondary | ICD-10-CM | POA: Diagnosis not present

## 2017-05-05 DIAGNOSIS — G301 Alzheimer's disease with late onset: Secondary | ICD-10-CM | POA: Diagnosis not present

## 2017-05-05 DIAGNOSIS — F0280 Dementia in other diseases classified elsewhere without behavioral disturbance: Secondary | ICD-10-CM | POA: Diagnosis not present

## 2017-05-05 DIAGNOSIS — I679 Cerebrovascular disease, unspecified: Secondary | ICD-10-CM | POA: Diagnosis not present

## 2017-05-05 DIAGNOSIS — E7800 Pure hypercholesterolemia, unspecified: Secondary | ICD-10-CM | POA: Diagnosis not present

## 2017-05-08 DIAGNOSIS — R488 Other symbolic dysfunctions: Secondary | ICD-10-CM | POA: Diagnosis not present

## 2017-05-22 DIAGNOSIS — M79675 Pain in left toe(s): Secondary | ICD-10-CM | POA: Diagnosis not present

## 2017-05-22 DIAGNOSIS — E119 Type 2 diabetes mellitus without complications: Secondary | ICD-10-CM | POA: Diagnosis not present

## 2017-05-22 DIAGNOSIS — B351 Tinea unguium: Secondary | ICD-10-CM | POA: Diagnosis not present

## 2017-05-22 DIAGNOSIS — M79674 Pain in right toe(s): Secondary | ICD-10-CM | POA: Diagnosis not present

## 2017-07-10 DIAGNOSIS — G459 Transient cerebral ischemic attack, unspecified: Secondary | ICD-10-CM | POA: Diagnosis not present

## 2017-08-30 ENCOUNTER — Emergency Department
Admission: EM | Admit: 2017-08-30 | Discharge: 2017-08-30 | Disposition: A | Discharge status: Home / Self Care | Payer: PPO | Attending: Emergency Medicine | Admitting: Emergency Medicine

## 2017-08-30 ENCOUNTER — Encounter: Payer: Self-pay | Admitting: Emergency Medicine

## 2017-08-30 DIAGNOSIS — G309 Alzheimer's disease, unspecified: Secondary | ICD-10-CM | POA: Diagnosis not present

## 2017-08-30 DIAGNOSIS — R252 Cramp and spasm: Secondary | ICD-10-CM | POA: Diagnosis not present

## 2017-08-30 DIAGNOSIS — E860 Dehydration: Secondary | ICD-10-CM | POA: Diagnosis not present

## 2017-08-30 HISTORY — DX: Alzheimer's disease, unspecified: G30.9

## 2017-08-30 HISTORY — DX: Dementia in other diseases classified elsewhere, unspecified severity, without behavioral disturbance, psychotic disturbance, mood disturbance, and anxiety: F02.80

## 2017-08-30 LAB — BASIC METABOLIC PANEL
ANION GAP: 8 (ref 5–15)
BUN: 14 mg/dL (ref 6–20)
CHLORIDE: 107 mmol/L (ref 101–111)
CO2: 25 mmol/L (ref 22–32)
Calcium: 8.3 mg/dL — ABNORMAL LOW (ref 8.9–10.3)
Creatinine, Ser: 0.83 mg/dL (ref 0.44–1.00)
GLUCOSE: 83 mg/dL (ref 65–99)
POTASSIUM: 5 mmol/L (ref 3.5–5.1)
Sodium: 140 mmol/L (ref 135–145)

## 2017-08-30 LAB — PHOSPHORUS: PHOSPHORUS: 3 mg/dL (ref 2.5–4.6)

## 2017-08-30 LAB — CK: Total CK: 209 U/L (ref 38–234)

## 2017-08-30 LAB — MAGNESIUM: MAGNESIUM: 1.8 mg/dL (ref 1.7–2.4)

## 2017-08-30 MED ORDER — SODIUM CHLORIDE 0.9 % IV BOLUS (SEPSIS)
500.0000 mL | Freq: Once | INTRAVENOUS | Status: AC
  Administered 2017-08-30: 500 mL via INTRAVENOUS

## 2017-08-30 NOTE — ED Provider Notes (Signed)
[facility] Emergency Department Provider Note  ____________________________________________   First MD Initiated Contact with Patient 08/30/17 2106     (approximate)  I have reviewed the triage vital signs and the nursing notes.   HISTORY  Chief Complaint Leg Pain  Level V exemption history Limited by the patient's Alzheimer's disease  HPI Cynthia Lopez is a 83 y.o. female who is brought to the emergency department by her daughter for 2 hours of bilateral lower extremity leg cramping. The patient has a long history of cramps and takes several multivitamins a day. Normally the daughter gives her a glass of tonic water which helps although today it did not. Cramps had resolved by the time she got to the emergency department. Her chest pain shortness of breath about pain nausea or vomiting. No trauma. No change in her medications.   Past Medical History:  Diagnosis Date  . Alzheimer disease     There are no active problems to display for this patient.   Past Surgical History:  Procedure Laterality Date  . KNEE SURGERY      Prior to Admission medications   Not on File    Allergies Penicillins and Sulfa antibiotics  No family history on file.  Social History Social History  Substance Use Topics  . Smoking status: Never Smoker  . Smokeless tobacco: Never Used  . Alcohol use No    Review of Systems Level V exemption history Limited by the patient's Alzheimer's disease  ____________________________________________   PHYSICAL EXAM:  VITAL SIGNS: ED Triage Vitals  Enc Vitals Group     BP 08/30/17 2028 (!) 111/47     Pulse Rate 08/30/17 2028 75     Resp 08/30/17 2028 18     Temp 08/30/17 2027 98.3 F (36.8 C)     Temp Source 08/30/17 2027 Oral     SpO2 08/30/17 2028 100 %     Weight 08/30/17 2028 133 lb 13.1 oz (60.7 kg)     Height --      Head Circumference --      Peak Flow --      Pain Score --      Pain Loc --      Pain  Edu? --      Excl. in [place]? --     Constitutional: Pleasant cooperative no distress Eyes: PERRL EOMI. Head: Atraumatic. Nose: No congestion/rhinnorhea. Mouth/Throat: No trismus Neck: No stridor.   Cardiovascular: Normal rate, regular rhythm. Grossly normal heart sounds.  Good peripheral circulation. Respiratory: Normal respiratory effort.  No retractions. Lungs CTAB and moving good air Gastrointestinal: Soft nontender Musculoskeletal: No lower extremity edema  compartments soft to posterior cells. His pupils bilaterally Neurologic: No gross focal neurologic deficits are appreciated. Skin:  Skin is warm, dry and intact. No rash noted. Psychiatric: Moderate dementia  ____________________________________________   _____________________________________   LABS (all labs ordered are listed, but only abnormal results are displayed)  Labs Reviewed  BASIC METABOLIC PANEL - Abnormal; Notable for the following:       Result Value   Calcium 8.3 (*)    All other components within normal limits  MAGNESIUM  PHOSPHORUS  CK    Slight hypocalcemia and low end of normal hypomagnesemia __________________________________________  EKG   ____________________________________________  RADIOLOGY   ____________________________________________   PROCEDURES  Procedure(s) performed: no  Procedures  Critical Care performed: no  Observation: no ____________________________________________   INITIAL IMPRESSION / ASSESSMENT AND PLAN / ED COURSE  Pertinent labs &  imaging results that were available during my care of the patient were reviewed by me and considered in my medical decision making (see chart for details).  The patient is well-appearing with unremarkable exam. Her electrolytes checked given her cramping and given a small bolus which seems to have helped her symptoms. I've encouraged her to continue her multivitamins and to follow up with primary care. Discharged home in  improved condition.      ____________________________________________   FINAL CLINICAL IMPRESSION(S) / ED DIAGNOSES  Final diagnoses:  Muscle cramps  Dehydration      NEW MEDICATIONS STARTED DURING THIS VISIT:  There are no discharge medications for this patient.    Note:  This document was prepared using Dragon voice recognition software and may include unintentional dictation errors.     Darel Hong, MD 08/30/17 2342

## 2017-08-30 NOTE — ED Notes (Signed)
Pt discharged to home.  Family member driving.  Discharge instructions reviewed.  Verbalized understanding.  No questions or concerns at this time.  Teach back verified.  Pt in NAD.  No items left in ED.   

## 2017-08-30 NOTE — Discharge Instructions (Signed)
Please make sure Ms. Delk remains well hydrated and follows up with her primary care physician as needed. Return to the emergency department for any concerns.  It was a pleasure to take care of you today, and thank you for coming to our emergency department.  If you have any questions or concerns before leaving please ask the nurse to grab me and I'm more than happy to go through your aftercare instructions again.  If you were prescribed any opioid pain medication today such as Norco, Vicodin, Percocet, morphine, hydrocodone, or oxycodone please make sure you do not drive when you are taking this medication as it can alter your ability to drive safely.  If you have any concerns once you are home that you are not improving or are in fact getting worse before you can make it to your follow-up appointment, please do not hesitate to call 911 and come back for further evaluation.  Darel Hong, MD  Results for orders placed or performed during the hospital encounter of 49/70/26  Basic metabolic panel  Result Value Ref Range   Sodium 140 135 - 145 mmol/L   Potassium 5.0 3.5 - 5.1 mmol/L   Chloride 107 101 - 111 mmol/L   CO2 25 22 - 32 mmol/L   Glucose, Bld 83 65 - 99 mg/dL   BUN 14 6 - 20 mg/dL   Creatinine, Ser 0.83 0.44 - 1.00 mg/dL   Calcium 8.3 (L) 8.9 - 10.3 mg/dL   GFR calc non Af Amer >60 >60 mL/min   GFR calc Af Amer >60 >60 mL/min   Anion gap 8 5 - 15  Magnesium  Result Value Ref Range   Magnesium 1.8 1.7 - 2.4 mg/dL  Phosphorus  Result Value Ref Range   Phosphorus 3.0 2.5 - 4.6 mg/dL  CK  Result Value Ref Range   Total CK 209 38 - 234 U/L

## 2017-08-30 NOTE — ED Triage Notes (Signed)
Per family patient has been complaining of intermittent bilateral leg pain times two hours. Family gave patient tonic water which they state normally help with cramps but that it did not help. Patient denies any pain at this time.

## 2017-08-30 NOTE — ED Notes (Signed)
Pt reports that she started having bilat leg pain from knee to ankle this afternoon - pt states the pain has resolved at this time - pt denies any pain and any other symptoms

## 2017-09-25 DIAGNOSIS — E860 Dehydration: Secondary | ICD-10-CM | POA: Diagnosis not present

## 2017-09-25 DIAGNOSIS — R8290 Unspecified abnormal findings in urine: Secondary | ICD-10-CM | POA: Diagnosis not present

## 2017-09-25 DIAGNOSIS — R350 Frequency of micturition: Secondary | ICD-10-CM | POA: Diagnosis not present

## 2017-09-25 DIAGNOSIS — R300 Dysuria: Secondary | ICD-10-CM | POA: Diagnosis not present

## 2017-10-05 DIAGNOSIS — Z23 Encounter for immunization: Secondary | ICD-10-CM | POA: Diagnosis not present

## 2017-10-09 DIAGNOSIS — L309 Dermatitis, unspecified: Secondary | ICD-10-CM | POA: Diagnosis not present

## 2017-10-31 DIAGNOSIS — E7800 Pure hypercholesterolemia, unspecified: Secondary | ICD-10-CM | POA: Diagnosis not present

## 2017-10-31 DIAGNOSIS — Z79899 Other long term (current) drug therapy: Secondary | ICD-10-CM | POA: Diagnosis not present

## 2017-11-06 DIAGNOSIS — Z0000 Encounter for general adult medical examination without abnormal findings: Secondary | ICD-10-CM | POA: Diagnosis not present

## 2018-03-18 ENCOUNTER — Encounter: Payer: Self-pay | Admitting: Emergency Medicine

## 2018-03-18 ENCOUNTER — Other Ambulatory Visit: Payer: Self-pay

## 2018-03-18 ENCOUNTER — Emergency Department: Payer: PPO

## 2018-03-18 ENCOUNTER — Emergency Department
Admission: EM | Admit: 2018-03-18 | Discharge: 2018-03-18 | Disposition: A | Discharge status: Home / Self Care | Payer: PPO | Attending: Emergency Medicine | Admitting: Emergency Medicine

## 2018-03-18 DIAGNOSIS — G309 Alzheimer's disease, unspecified: Secondary | ICD-10-CM | POA: Diagnosis not present

## 2018-03-18 DIAGNOSIS — M7989 Other specified soft tissue disorders: Secondary | ICD-10-CM | POA: Diagnosis not present

## 2018-03-18 DIAGNOSIS — R2242 Localized swelling, mass and lump, left lower limb: Secondary | ICD-10-CM | POA: Diagnosis present

## 2018-03-18 HISTORY — DX: Transient cerebral ischemic attack, unspecified: G45.9

## 2018-03-18 HISTORY — DX: Pure hypercholesterolemia, unspecified: E78.00

## 2018-03-18 MED ORDER — CLINDAMYCIN HCL 300 MG PO CAPS: 300.0000 mg | ORAL_CAPSULE | Freq: Three times a day (TID) | ORAL | 0 refills | Status: AC

## 2018-03-18 NOTE — ED Provider Notes (Signed)
[facility] Emergency Department Provider Note ____________________________________________   First MD Initiated Contact with Patient 03/18/18 1104     (approximate)  I have reviewed the triage vital signs and the nursing notes.   HISTORY  Chief Complaint swelling  Level 5 caveat: History of present illness limited by dementia  HPI Yessika Otte is a 84 y.o. female with past medical history as noted below who presents with left lower leg swelling over the last week, somewhat intermittent, worse over the foot and ankle, and not preceded by any known injury.  No prior history of this.  Past Medical History:  Diagnosis Date  . Alzheimer disease   . Hypercholesteremia   . Transient cerebral ischemia     There are no active problems to display for this patient.   Past Surgical History:  Procedure Laterality Date  . KNEE SURGERY      Prior to Admission medications   Medication Sig Start Date End Date Taking? Authorizing Provider  clindamycin (CLEOCIN) 300 MG capsule Take 1 capsule (300 mg total) by mouth 3 (three) times daily for 7 days. 03/20/18 03/27/18  Arta Silence, MD    Allergies Penicillins and Sulfa antibiotics  History reviewed. No pertinent family history.  Social History Social History   Tobacco Use  . Smoking status: Never Smoker  . Smokeless tobacco: Never Used  Substance Use Topics  . Alcohol use: No  . Drug use: No    Review of Systems Level 5 caveat: Unable to obtain review of systems due to dementia    ____________________________________________   PHYSICAL EXAM:  VITAL SIGNS: ED Triage Vitals  Enc Vitals Group     BP 03/18/18 1002 122/64     Pulse Rate 03/18/18 1002 76     Resp 03/18/18 1002 16     Temp 03/18/18 1002 98.2 F (36.8 C)     Temp Source 03/18/18 1002 Oral     SpO2 03/18/18 1002 99 %     Weight 03/18/18 1003 130 lb (59 kg)     Height 03/18/18 1003 5\' 3"  (1.6 m)     Head Circumference --       Peak Flow --      Pain Score --      Pain Loc --      Pain Edu? --      Excl. in [place]? --     Constitutional: Alert.  Comfortable appearing. Eyes: Conjunctivae are normal.  Head: Atraumatic. Nose: No congestion/rhinnorhea. Mouth/Throat: Mucous membranes are moist.   Neck: Normal range of motion.  Cardiovascular: Good peripheral circulation. Respiratory: Normal respiratory effort.  Gastrointestinal: No distention.  Musculoskeletal:  Extremities warm and well perfused.  Left lower leg with trace swelling to shin and ankle area, with possible faint erythema.  No induration.  No streaking.  2+ DP pulse. Neurologic:  Normal speech and language. No gross focal neurologic deficits are appreciated.  Motor and sensory intact to bilateral lower legs.   Skin:  Skin is warm and dry. Psychiatric: Calm and cooperative.  ____________________________________________   LABS (all labs ordered are listed, but only abnormal results are displayed)  Labs Reviewed - No data to display ____________________________________________  EKG   ____________________________________________  RADIOLOGY   XR left ankle/tib/fib: No acute fracture [place] LLE: No acute DVT ____________________________________________   PROCEDURES  Procedure(s) performed: No  Procedures  Critical Care performed: No ____________________________________________   INITIAL IMPRESSION / ASSESSMENT AND PLAN / ED COURSE  Pertinent labs & imaging  results that were available during my care of the patient were reviewed by me and considered in my medical decision making (see chart for details).  84 year old female with history of dementia presents with left lower leg swelling for the last week which has been somewhat waxing and waning.  No known trauma although family members states that it would be difficult to be sure.  She has no shortness of breath or respiratory symptoms.  Differential includes DVT, occult trauma,  cellulitis, or nonspecific peripheral edema.  Will obtain x-ray and ultrasound.  No indication for labs at this time.    ----------------------------------------- 12:37 PM on 03/18/2018 -----------------------------------------  Imaging is negative.  Given the very questionable and faint erythema, the lack of warmth or induration, and the lack of fever, my suspicion for cellulitis is low.  I discussed this with the family members present.  We agreed that I would provide a prescription for an oral antibiotic, and if the leg became more red, warm, more inflamed appearing over the next 1-2 days, they can start the antibiotic.  If the symptoms improve or resolve, then they do not need to give it.  They agree with this plan.  Return precautions given and they expressed understanding.  They agree to follow-up with the primary care doctor.  ____________________________________________   FINAL CLINICAL IMPRESSION(S) / ED DIAGNOSES  Final diagnoses:  Swelling of left lower extremity      NEW MEDICATIONS STARTED DURING THIS VISIT:  New Prescriptions   CLINDAMYCIN (CLEOCIN) 300 MG CAPSULE    Take 1 capsule (300 mg total) by mouth 3 (three) times daily for 7 days.     Note:  This document was prepared using Dragon voice recognition software and may include unintentional dictation errors.    Arta Silence, MD 03/18/18 [phone]

## 2018-03-18 NOTE — Discharge Instructions (Addendum)
Return to the ER for new, worsening, or persistent swelling, rash, inability to bear weight on the leg, fever, weakness, or any other new or worsening symptoms that concern you.  It is possible that this could be the beginnings of the skin or soft tissue infection (referred to a cellulitis).  If the leg becomes more red or inflamed appearing over the next few days, you can start the prescribed antibiotic.  If the symptoms improve and resolve, you do not need to give the antibiotic.  Follow-up with the regular doctor within the next week if possible.

## 2018-03-18 NOTE — ED Notes (Signed)
Pt has swollen on the left ankle family and pt un sure what happen. Pt reports no pain and this RN able to manipulate without signs of pain.

## 2018-03-18 NOTE — ED Triage Notes (Addendum)
Pt has had swelling to left foot/ankle over last week that has been getting worse.  Whole leg was swollen last night in calf area per family but seems a little better today. Family worried about a blood clot.  No injury.

## 2018-03-19 DIAGNOSIS — B351 Tinea unguium: Secondary | ICD-10-CM | POA: Diagnosis not present

## 2018-03-19 DIAGNOSIS — M79674 Pain in right toe(s): Secondary | ICD-10-CM | POA: Diagnosis not present

## 2018-03-19 DIAGNOSIS — M79675 Pain in left toe(s): Secondary | ICD-10-CM | POA: Diagnosis not present

## 2018-03-26 DIAGNOSIS — M25472 Effusion, left ankle: Secondary | ICD-10-CM | POA: Diagnosis not present

## 2018-04-30 DIAGNOSIS — Z79899 Other long term (current) drug therapy: Secondary | ICD-10-CM | POA: Diagnosis not present

## 2018-04-30 DIAGNOSIS — E7800 Pure hypercholesterolemia, unspecified: Secondary | ICD-10-CM | POA: Diagnosis not present

## 2018-05-07 DIAGNOSIS — K219 Gastro-esophageal reflux disease without esophagitis: Secondary | ICD-10-CM | POA: Diagnosis not present

## 2018-05-07 DIAGNOSIS — E440 Moderate protein-calorie malnutrition: Secondary | ICD-10-CM | POA: Diagnosis not present

## 2018-05-07 DIAGNOSIS — I679 Cerebrovascular disease, unspecified: Secondary | ICD-10-CM | POA: Diagnosis not present

## 2018-05-07 DIAGNOSIS — E7800 Pure hypercholesterolemia, unspecified: Secondary | ICD-10-CM | POA: Diagnosis not present

## 2018-05-07 DIAGNOSIS — Z79899 Other long term (current) drug therapy: Secondary | ICD-10-CM | POA: Diagnosis not present

## 2018-05-07 DIAGNOSIS — F0280 Dementia in other diseases classified elsewhere without behavioral disturbance: Secondary | ICD-10-CM | POA: Diagnosis not present

## 2018-05-07 DIAGNOSIS — G301 Alzheimer's disease with late onset: Secondary | ICD-10-CM | POA: Diagnosis not present

## 2018-07-10 DIAGNOSIS — G459 Transient cerebral ischemic attack, unspecified: Secondary | ICD-10-CM | POA: Diagnosis not present

## 2018-08-28 DIAGNOSIS — K219 Gastro-esophageal reflux disease without esophagitis: Secondary | ICD-10-CM | POA: Diagnosis not present

## 2018-08-28 DIAGNOSIS — G301 Alzheimer's disease with late onset: Secondary | ICD-10-CM | POA: Diagnosis not present

## 2018-08-28 DIAGNOSIS — F0280 Dementia in other diseases classified elsewhere without behavioral disturbance: Secondary | ICD-10-CM | POA: Diagnosis not present

## 2018-09-13 ENCOUNTER — Ambulatory Visit
Admission: EM | Admit: 2018-09-13 | Discharge: 2018-09-13 | Disposition: A | Discharge status: Home / Self Care | Payer: PPO | Attending: Emergency Medicine | Admitting: Emergency Medicine

## 2018-09-13 ENCOUNTER — Other Ambulatory Visit: Payer: Self-pay

## 2018-09-13 ENCOUNTER — Encounter: Payer: Self-pay | Admitting: Emergency Medicine

## 2018-09-13 DIAGNOSIS — R1084 Generalized abdominal pain: Secondary | ICD-10-CM

## 2018-09-13 LAB — CBC WITH DIFFERENTIAL/PLATELET
Basophils Absolute: 0 10*3/uL (ref 0–0.1)
Basophils Relative: 1 %
Eosinophils Absolute: 0.1 10*3/uL (ref 0–0.7)
Eosinophils Relative: 3 %
HCT: 35.2 % (ref 35.0–47.0)
Hemoglobin: 11.9 g/dL — ABNORMAL LOW (ref 12.0–16.0)
Lymphocytes Relative: 26 %
Lymphs Abs: 1.1 10*3/uL (ref 1.0–3.6)
MCH: 31.3 pg (ref 26.0–34.0)
MCHC: 33.9 g/dL (ref 32.0–36.0)
MCV: 92.6 fL (ref 80.0–100.0)
Monocytes Absolute: 0.4 10*3/uL (ref 0.2–0.9)
Monocytes Relative: 8 %
Neutro Abs: 2.7 10*3/uL (ref 1.4–6.5)
Neutrophils Relative %: 62 %
Platelets: 154 10*3/uL (ref 150–440)
RBC: 3.8 MIL/uL (ref 3.80–5.20)
RDW: 14 % (ref 11.5–14.5)
WBC: 4.4 10*3/uL (ref 3.6–11.0)

## 2018-09-13 LAB — COMPREHENSIVE METABOLIC PANEL
ALT: 13 U/L (ref 0–44)
AST: 19 U/L (ref 15–41)
Albumin: 3.6 g/dL (ref 3.5–5.0)
Alkaline Phosphatase: 45 U/L (ref 38–126)
Anion gap: 8 (ref 5–15)
BUN: 12 mg/dL (ref 8–23)
CO2: 29 mmol/L (ref 22–32)
Calcium: 9.4 mg/dL (ref 8.9–10.3)
Chloride: 106 mmol/L (ref 98–111)
Creatinine, Ser: 0.99 mg/dL (ref 0.44–1.00)
GFR calc Af Amer: 58 mL/min — ABNORMAL LOW (ref >60)
GFR calc non Af Amer: 50 mL/min — ABNORMAL LOW (ref >60)
Glucose, Bld: 87 mg/dL (ref 70–99)
Potassium: 3.6 mmol/L (ref 3.5–5.1)
Sodium: 143 mmol/L (ref 135–145)
Total Bilirubin: 1.2 mg/dL (ref 0.3–1.2)
Total Protein: 6.2 g/dL — ABNORMAL LOW (ref 6.5–8.1)

## 2018-09-13 LAB — URINALYSIS, COMPLETE (UACMP) WITH MICROSCOPIC
Bilirubin Urine: NEGATIVE
Glucose, UA: NEGATIVE mg/dL
Hgb urine dipstick: NEGATIVE
Ketones, ur: NEGATIVE mg/dL
Nitrite: NEGATIVE
Protein, ur: NEGATIVE mg/dL
Specific Gravity, Urine: 1.02 (ref 1.005–1.030)
pH: 5.5 (ref 5.0–8.0)

## 2018-09-13 NOTE — ED Triage Notes (Signed)
Pt c/o abdominal pain on the left side around the waist line. Denies nausea, vomiting, diarrhea or urinary symptoms. . Started holding her side yesterday but for the last 2-3 days she has been feeling bad per her daughter. Pt has dementia.

## 2018-09-13 NOTE — ED Provider Notes (Signed)
[facility]    CSN: [id number] Arrival date & time: 09/13/18  1002     History   Chief Complaint Chief Complaint  Patient presents with  . Abdominal Pain    HPI Cynthia Lopez is a 84 y.o. female.   HPI  84 year old female with advanced Alzheimer's accompanied  by her daughter, presents with abdominal pain on the left side indicating around the waistline level.  Had no nausea vomiting diarrhea or urinary symptoms.  The last 2 to 3 days the daughter says she was not feeling well and just started holding her side yesterday.  Denies any fever or chills.  Vital signs today are temperature 98 pulse rate of 81 blood pressure 125/52 respirations of 16 and O2 sats of 98% on room air.  Past medical history includes Alzheimer's disease TIAs and hypercholesterolemia.  She does not appear ill.          Past Medical History:  Diagnosis Date  . Alzheimer disease   . Hypercholesteremia   . Transient cerebral ischemia     There are no active problems to display for this patient.   Past Surgical History:  Procedure Laterality Date  . KNEE SURGERY      OB History   None      Home Medications    Prior to Admission medications   Medication Sig Start Date End Date Taking? Authorizing Provider  aspirin EC 81 MG tablet Take by mouth.   Yes [provider]  b complex vitamins tablet Take by mouth.   Yes [provider]  Cranberry (RA CRANBERRY) 500 MG CAPS Take by mouth.   Yes [provider]  donepezil (ARICEPT) 10 MG tablet donepezil 10 mg tablet 08/21/18  Yes [provider]  ibuprofen (ADVIL,MOTRIN) 200 MG tablet Take 200 mg by mouth every 6 (six) hours as needed.   Yes [provider]  Magnesium 250 MG TABS Take by mouth.   Yes [provider]  Multiple Vitamin (MULTI-VITAMINS) TABS Take by mouth.   Yes [provider]  omeprazole (PRILOSEC) 20 MG capsule omeprazole 20 mg capsule,delayed release  08/30/18  Yes [provider]  ranitidine (ZANTAC) 300 MG tablet Take by mouth. 08/28/18 08/28/19 Yes [provider]  triamcinolone cream (KENALOG) 0.5 % Apply topically. 10/09/17  Yes [provider]  Wheat Dextrin (BENEFIBER DRINK MIX PO) Take by mouth.   Yes [provider]  vitamin C (ASCORBIC ACID) 500 MG tablet Take by mouth.    [provider]    Family History History reviewed. No pertinent family history.  Social History Social History   Tobacco Use  . Smoking status: Never Smoker  . Smokeless tobacco: Never Used  Substance Use Topics  . Alcohol use: No  . Drug use: No     Allergies   Penicillins and Sulfa antibiotics   Review of Systems Review of Systems  Constitutional: Negative for activity change, appetite change, chills, fatigue and fever.  Gastrointestinal: Positive for abdominal pain. Negative for abdominal distention, constipation, nausea and vomiting.  All other systems reviewed and are negative.    Physical Exam Triage Vital Signs ED Triage Vitals  Enc Vitals Group     BP 09/13/18 1021 (!) 125/52     Pulse Rate 09/13/18 1021 81     Resp 09/13/18 1021 16     Temp 09/13/18 1021 98.2 F (36.8 C)     Temp Source 09/13/18 1021 Oral     SpO2 09/13/18  1021 98 %     Weight 09/13/18 1016 124 lb (56.2 kg)     Height --      Head Circumference --      Peak Flow --      Pain Score --      Pain Loc --      Pain Edu? --      Excl. in [place]? --    No data found.  Updated Vital Signs BP (!) 125/52 (BP Location: Left Arm)   Pulse 81   Temp 98.2 F (36.8 C) (Oral)   Resp 16   Wt 124 lb (56.2 kg)   SpO2 98%   BMI 21.97 kg/m   Visual Acuity Right Eye Distance:   Left Eye Distance:   Bilateral Distance:    Right Eye Near:   Left Eye Near:    Bilateral Near:     Physical Exam  Constitutional: She appears well-developed and well-nourished.  Non-toxic appearance. She does not appear ill. No distress.    HENT:  Head: Normocephalic.  Eyes: Pupils are equal, round, and reactive to light.  Pulmonary/Chest: Effort normal and breath sounds normal.  Abdominal: Bowel sounds are normal. She exhibits abdominal bruit. She exhibits no distension and no mass. There is tenderness in the periumbilical area. There is no rigidity, no rebound, no guarding, no CVA tenderness and negative Murphy's sign.  Skin: Skin is warm and dry.  Psychiatric: She has a normal mood and affect. Her behavior is normal.  Nursing note and vitals reviewed.    UC Treatments / Results  Labs (all labs ordered are listed, but only abnormal results are displayed) Labs Reviewed  CBC WITH DIFFERENTIAL/PLATELET - Abnormal; Notable for the following components:      Result Value   Hemoglobin 11.9 (*)    All other components within normal limits  COMPREHENSIVE METABOLIC PANEL - Abnormal; Notable for the following components:   Total Protein 6.2 (*)    GFR calc non Af Amer 50 (*)    GFR calc Af Amer 58 (*)    All other components within normal limits  URINALYSIS, COMPLETE (UACMP) WITH MICROSCOPIC - Abnormal; Notable for the following components:   Leukocytes, UA SMALL (*)    Bacteria, UA FEW (*)    All other components within normal limits    EKG None  Radiology No results found.  Procedures Procedures (including critical care time)  Medications Ordered in UC Medications - No data to display  Initial Impression / Assessment and Plan / UC Course  I have reviewed the triage vital signs and the nursing notes.  Pertinent labs & imaging results that were available during my care of the patient were reviewed by me and considered in my medical decision making (see chart for details).     Discussed with the patient and her daughter my findings and the laboratory values.  Are all very encouraging.  He is afebrile with a normal laboratory, very generalized nonacute abdominal findings.  Told the daughter that she should just  observe mom at the present time.  She should obtain twice daily temperature readings.  He does develop high fevers worsening of her pain in her belly, nausea, vomiting or diarrhea she should take her to the emergency room.  Otherwise I recommend that they follow-up with her primary care physician in a week or 2. Final Clinical Impressions(s) / UC Diagnoses   Final diagnoses:  Generalized abdominal pain     Discharge Instructions  Examination today and laboratories are reassuring. If She develops fevers ,nausea ,vomiting ,diarrhea or her pain worsens ,recommend going to the emergency room for further evaluation.  Recommend monitoring of her temperature twice daily.  Also recommend following up with your primary care physician in a week or 2 for reevaluation    ED Prescriptions    None     Controlled Substance Prescriptions [place] Controlled Substance Registry consulted? Not Applicable   Lorin Picket, PA-C 09/13/18 1253

## 2018-09-13 NOTE — Discharge Instructions (Addendum)
Examination today and laboratories are reassuring. If She develops fevers ,nausea ,vomiting ,diarrhea or her pain worsens ,recommend going to the emergency room for further evaluation.  Recommend monitoring of her temperature twice daily.  Also recommend following up with your primary care physician in a week or 2 for reevaluation

## 2018-09-15 ENCOUNTER — Emergency Department
Admission: EM | Admit: 2018-09-15 | Discharge: 2018-09-15 | Disposition: A | Discharge status: Home / Self Care | Payer: PPO | Attending: Emergency Medicine | Admitting: Emergency Medicine

## 2018-09-15 ENCOUNTER — Emergency Department: Payer: PPO

## 2018-09-15 ENCOUNTER — Encounter: Payer: Self-pay | Admitting: Emergency Medicine

## 2018-09-15 DIAGNOSIS — F0280 Dementia in other diseases classified elsewhere without behavioral disturbance: Secondary | ICD-10-CM | POA: Diagnosis not present

## 2018-09-15 DIAGNOSIS — Z8673 Personal history of transient ischemic attack (TIA), and cerebral infarction without residual deficits: Secondary | ICD-10-CM | POA: Insufficient documentation

## 2018-09-15 DIAGNOSIS — R1032 Left lower quadrant pain: Secondary | ICD-10-CM | POA: Insufficient documentation

## 2018-09-15 DIAGNOSIS — G309 Alzheimer's disease, unspecified: Secondary | ICD-10-CM | POA: Diagnosis not present

## 2018-09-15 DIAGNOSIS — Z79899 Other long term (current) drug therapy: Secondary | ICD-10-CM | POA: Diagnosis not present

## 2018-09-15 DIAGNOSIS — Z7982 Long term (current) use of aspirin: Secondary | ICD-10-CM | POA: Insufficient documentation

## 2018-09-15 LAB — CBC
HCT: 35.4 % (ref 35.0–47.0)
HEMOGLOBIN: 12.2 g/dL (ref 12.0–16.0)
MCH: 32.3 pg (ref 26.0–34.0)
MCHC: 34.6 g/dL (ref 32.0–36.0)
MCV: 93.3 fL (ref 80.0–100.0)
Platelets: 153 10*3/uL (ref 150–440)
RBC: 3.79 MIL/uL — AB (ref 3.80–5.20)
RDW: 14.6 % — ABNORMAL HIGH (ref 11.5–14.5)
WBC: 4.7 10*3/uL (ref 3.6–11.0)

## 2018-09-15 LAB — COMPREHENSIVE METABOLIC PANEL
ALBUMIN: 3.9 g/dL (ref 3.5–5.0)
ALT: 14 U/L (ref 0–44)
ANION GAP: 9 (ref 5–15)
AST: 22 U/L (ref 15–41)
Alkaline Phosphatase: 44 U/L (ref 38–126)
BUN: 11 mg/dL (ref 8–23)
CHLORIDE: 107 mmol/L (ref 98–111)
CO2: 30 mmol/L (ref 22–32)
Calcium: 9.8 mg/dL (ref 8.9–10.3)
Creatinine, Ser: 1.11 mg/dL — ABNORMAL HIGH (ref 0.44–1.00)
GFR calc non Af Amer: 44 mL/min — ABNORMAL LOW (ref >60)
GFR, EST AFRICAN AMERICAN: 51 mL/min — AB (ref >60)
Glucose, Bld: 113 mg/dL — ABNORMAL HIGH (ref 70–99)
Potassium: 4.1 mmol/L (ref 3.5–5.1)
SODIUM: 146 mmol/L — AB (ref 135–145)
Total Bilirubin: 1.6 mg/dL — ABNORMAL HIGH (ref 0.3–1.2)
Total Protein: 6.6 g/dL (ref 6.5–8.1)

## 2018-09-15 LAB — URINALYSIS, COMPLETE (UACMP) WITH MICROSCOPIC
BACTERIA UA: NONE SEEN
BILIRUBIN URINE: NEGATIVE
Glucose, UA: NEGATIVE mg/dL
Hgb urine dipstick: NEGATIVE
KETONES UR: NEGATIVE mg/dL
Leukocytes, UA: NEGATIVE
Nitrite: NEGATIVE
PROTEIN: NEGATIVE mg/dL
Specific Gravity, Urine: 1.023 (ref 1.005–1.030)
pH: 5 (ref 5.0–8.0)

## 2018-09-15 LAB — LIPASE, BLOOD: LIPASE: 38 U/L (ref 11–51)

## 2018-09-15 MED ORDER — SODIUM CHLORIDE 0.9 % IV BOLUS
1000.0000 mL | Freq: Once | INTRAVENOUS | Status: AC
  Administered 2018-09-15: 1000 mL via INTRAVENOUS

## 2018-09-15 MED ORDER — IOHEXOL 300 MG/ML  SOLN
75.0000 mL | Freq: Once | INTRAMUSCULAR | Status: AC | PRN
  Administered 2018-09-15: 75 mL via INTRAVENOUS

## 2018-09-15 NOTE — Progress Notes (Signed)
   09/15/18 1320  Clinical Encounter Type  Visited With Patient and family together  Visit Type Initial;Spiritual support;ED  Referral From Nurse  Consult/Referral To Chaplain  Spiritual Encounters  Spiritual Needs Emotional;Other (Comment)   Ben Hill encountered the Patient's son-in-law in the hallway outside of Ms. Wilborn's room. He was waiting for the patient to use the rest room. Once she was finished and the son-in-law re-entered the room I visited with Ms. Klawitter. The patient appeared to be in No distress and in good spirits. I practice active listening and compassion. I will follow up if needed.

## 2018-09-15 NOTE — ED Notes (Signed)
Pt assisted to restroom, steady gait with standby assist.

## 2018-09-15 NOTE — ED Provider Notes (Signed)
[facility] Emergency Department Provider Note  ____________________________________________  Time seen: Approximately 11:28 AM  I have reviewed the triage vital signs and the nursing notes.   HISTORY  Chief Complaint Abdominal Pain  Level 5 caveat:  Portions of the history and physical were unable to be obtained due to dementia   HPI Cynthia Lopez is a 84 y.o. female with a history of Alzheimer's, hyperlipidemia, and TIA who presents for evaluation of abdominal pain.  History is gathered mostly from patient's daughter who lives with the patient.  She reports that for the last 4 days patient has been complaining of intermittent left-sided abdominal pain.  She has been holding her side and has had difficulty walking due to the pain.  No vomiting, no constipation, no diarrhea, no fever, no shortness of breath or chest pain.  At this time patient has no pain. Patient unable to describe the pain. Patient has had a prior appendectomy but no other abdominal surgeries.  Patient seen in urgent care 2 days ago with normal labs and urinalysis.  Past Medical History:  Diagnosis Date  . Alzheimer disease   . Hypercholesteremia   . Transient cerebral ischemia     Past Surgical History:  Procedure Laterality Date  . KNEE SURGERY      Prior to Admission medications   Medication Sig Start Date End Date Taking? Authorizing Provider  aspirin EC 81 MG tablet Take by mouth.   Yes [provider]  b complex vitamins tablet Take by mouth.   Yes [provider]  Calcium Carb-Cholecalciferol (CALTRATE 600+D3) 600-800 MG-UNIT TABS Take 1 tablet by mouth daily.   Yes [provider]  Cranberry (RA CRANBERRY) 500 MG CAPS Take 1,000 mg by mouth daily.    Yes [provider]  donepezil (ARICEPT) 10 MG tablet Take 10 mg by mouth daily.  08/21/18  Yes [provider]  Magnesium 250 MG TABS Take 250 mg by mouth daily.   Yes [provider]  Multiple Vitamin (MULTI-VITAMINS) TABS Take 1 tablet by mouth daily.    Yes [provider]  omeprazole (PRILOSEC) 20 MG capsule Take 20 mg by mouth 2 (two) times daily.  08/30/18  Yes [provider]  ranitidine (ZANTAC) 300 MG tablet Take 300 mg by mouth at bedtime.   Yes [provider]  triamcinolone cream (KENALOG) 0.5 % Apply 1 application topically 2 (two) times daily.  10/09/17  Yes [provider]  vitamin C (ASCORBIC ACID) 500 MG tablet Take 500 mg by mouth 2 (two) times daily.    Yes [provider]    Allergies Penicillins and Sulfa antibiotics  No family history on file.  Social History Social History   Tobacco Use  . Smoking status: Never Smoker  . Smokeless tobacco: Never Used  Substance Use Topics  . Alcohol use: No  . Drug use: No    Review of Systems  Constitutional: Negative for fever. Eyes: Negative for visual changes. ENT: Negative for sore throat. Neck: No neck pain  Cardiovascular: Negative for chest pain. Respiratory: Negative for shortness of breath. Gastrointestinal: + L sided abdominal pain. No vomiting or diarrhea. Genitourinary: Negative for dysuria. Musculoskeletal: Negative for back pain. Skin: Negative for rash. Neurological: Negative for headaches, weakness or numbness. Psych: No SI or HI  ____________________________________________   PHYSICAL EXAM:  VITAL SIGNS: ED Triage Vitals  Enc Vitals Group     BP 09/15/18 1007 (!) 118/56  Pulse Rate 09/15/18 1007 82     Resp 09/15/18 1007 16     Temp 09/15/18 1007 97.9 F (36.6 C)     Temp Source 09/15/18 1007 Oral     SpO2 09/15/18 1007 100 %     Weight 09/15/18 1008 124 lb (56.2 kg)     Height 09/15/18 1008 5\' 3"  (1.6 m)     Head Circumference --      Peak Flow --      Pain Score --      Pain Loc --      Pain Edu? --      Excl. in [place]? --     Constitutional: Alert and oriented. Well appearing and in no apparent  distress. HEENT:      Head: Normocephalic and atraumatic.         Eyes: Conjunctivae are normal. Sclera is non-icteric.       Mouth/Throat: Mucous membranes are moist.       Neck: Supple with no signs of meningismus. Cardiovascular: Regular rate and rhythm. No murmurs, gallops, or rubs. 2+ symmetrical distal pulses are present in all extremities. No JVD. Respiratory: Normal respiratory effort. Lungs are clear to auscultation bilaterally. No wheezes, crackles, or rhonchi.  Gastrointestinal: Soft, non tender, and non distended with positive bowel sounds. No rebound or guarding. Musculoskeletal: Nontender with normal range of motion in all extremities. No edema, cyanosis, or erythema of extremities. Neurologic: Normal speech and language. Face is symmetric. Moving all extremities. No gross focal neurologic deficits are appreciated. Skin: Skin is warm, dry and intact. No rash noted. Psychiatric: Mood and affect are normal. Speech and behavior are normal.  ____________________________________________   LABS (all labs ordered are listed, but only abnormal results are displayed)  Labs Reviewed  COMPREHENSIVE METABOLIC PANEL - Abnormal; Notable for the following components:      Result Value   Sodium 146 (*)    Glucose, Bld 113 (*)    Creatinine, Ser 1.11 (*)    Total Bilirubin 1.6 (*)    GFR calc non Af Amer 44 (*)    GFR calc Af Amer 51 (*)    All other components within normal limits  CBC - Abnormal; Notable for the following components:   RBC 3.79 (*)    RDW 14.6 (*)    All other components within normal limits  URINALYSIS, COMPLETE (UACMP) WITH MICROSCOPIC - Abnormal; Notable for the following components:   Color, Urine YELLOW (*)    APPearance CLOUDY (*)    All other components within normal limits  LIPASE, BLOOD   ____________________________________________  EKG  none  ____________________________________________  RADIOLOGY  I have personally reviewed the images  performed during this visit and I agree with the Radiologist's read.   Interpretation by Radiologist:  Ct Abdomen Pelvis W Contrast  Result Date: 09/15/2018 CLINICAL DATA:  Left lower quadrant abdominal pain EXAM: CT ABDOMEN AND PELVIS WITH CONTRAST TECHNIQUE: Multidetector CT imaging of the abdomen and pelvis was performed using the standard protocol following bolus administration of intravenous contrast. CONTRAST:  35mL OMNIPAQUE IOHEXOL 300 MG/ML  SOLN COMPARISON:  None. FINDINGS: LOWER CHEST: There is no basilar pleural or apical pericardial effusion. HEPATOBILIARY: Hepatic cyst measures 3.3 cm. No enhancing hepatic lesion. No biliary dilatation. The gallbladder is normal. PANCREAS: Pancreas divisum. No peripancreatic fluid collection or inflammation. No pancreatic ductal dilatation. SPLEEN: Calcified granuloma of the spleen. Multiple hypodensities, subcentimeter and too small to characterize accurately. ADRENALS/URINARY TRACT: --Adrenal glands: Normal. --Right kidney/ureter: No  hydronephrosis, nephroureterolithiasis, perinephric stranding or solid renal mass. --Left kidney/ureter: No hydronephrosis, nephroureterolithiasis, perinephric stranding or solid renal mass. --Urinary bladder: Normal for degree of distention STOMACH/BOWEL: --Stomach/Duodenum: There is no hiatal hernia or other gastric abnormality. The duodenal course and caliber are normal. --Small bowel: No dilatation or inflammation. --Colon: No focal abnormality. --Appendix: Normal. VASCULAR/LYMPHATIC: Atherosclerotic calcification is present within the non-aneurysmal abdominal aorta, without hemodynamically significant stenosis. The portal vein, splenic vein, superior mesenteric vein and IVC are patent. No abdominal or pelvic lymphadenopathy. REPRODUCTIVE: Right adnexal cysts measure up to 3.4 x 1.5 cm. The left adnexa is normal. There are small calcified uterine fibroids. MUSCULOSKELETAL. Multilevel degenerative disc disease and facet  arthrosis. No bony spinal canal stenosis. Thoracolumbar dextroscoliosis. OTHER: None. IMPRESSION: 1. No acute abnormality of the abdomen or pelvis. 2. Right adnexal cysts measuring up to 3.4 cm. These are almost certainly benign, but follow up ultrasound is recommended in 1 year according to the Society of Radiologists in Ultrasound 2010 Consensus Conference Statement (D Clovis Riley et al. Management of Asymptomatic Ovarian and Other Adnexal Cysts Imaged at [place]: Society of Radiologists in [place] Statement 2010. Radiology 256 (Sept 2010): 782-956.). Electronically Signed   By: Ulyses Jarred M.D.   On: 09/15/2018 13:46      ____________________________________________   PROCEDURES  Procedure(s) performed: None Procedures Critical Care performed:  None ____________________________________________   INITIAL IMPRESSION / ASSESSMENT AND PLAN / ED COURSE   84 y.o. female with a history of Alzheimer's, hyperlipidemia, and TIA who presents for evaluation of L sided abdominal pain x 4 days.  Patient is well-appearing, no distress, she has normal vital signs, abdomen is soft with no tenderness to palpation, no skin rashes, no palpable pulsatile masses.  Labs showing signs of mild dehydration with a sodium of 146 and a creatinine of 1.11, no leukocytosis, normal LFTs and lipase.  UA is pending.  We will send patient for CT abdomen pelvis.  Differential diagnoses including kidney stone versus diverticulitis versus constipation versus volvulus versus AAA versus SBO.  Clinical Course as of Sep 16 1507  Sat Sep 15, 2018  1505 CT and UA with no acute findings.  IVF were given for mild dehydration. Patient remains with no pain and no tenderness on palpation.  At this time she will be discharged home with follow-up with her primary care doctor.  Discussed return precautions for new or worsening abdominal pain, fever, or vomiting.   [CV]    Clinical Course User Index [CV] Alfred Levins [place],  MD     As part of my medical decision making, I reviewed the following data within the [place] History obtained from family, Nursing notes reviewed and incorporated, Labs reviewed , Old chart reviewed, Radiograph reviewed , Notes from prior ED visits and [place] Controlled Substance Database    Pertinent labs & imaging results that were available during my care of the patient were reviewed by me and considered in my medical decision making (see chart for details).    ____________________________________________   FINAL CLINICAL IMPRESSION(S) / ED DIAGNOSES  Final diagnoses:  Left lower quadrant pain      NEW MEDICATIONS STARTED DURING THIS VISIT:  ED Discharge Orders    None       Note:  This document was prepared using Dragon voice recognition software and may include unintentional dictation errors.    Alfred Levins, [place], MD 09/15/18 [phone]

## 2018-09-15 NOTE — ED Notes (Signed)
Pt ambulatory to toilet with 1 assist. Steady gait noted.

## 2018-09-15 NOTE — Discharge Instructions (Addendum)

## 2018-09-15 NOTE — ED Triage Notes (Signed)
Pt arrived with daughter whom pt lives with. Complaint in left lower abdominal pain that started tuesday. Pt poor historian due to hx of dementia. Pt was seen at PCP and no diagnosis was developed for symptoms. Daughter states she came back in today because the pain seemed to be worse. No n/v/d

## 2018-09-15 NOTE — ED Notes (Signed)
FIRST NURSE NOTE:  Pt here with caregiver, reports left side pain, pt has hx of dementia. Pt placed in wheelchair on arrival.

## 2018-10-21 ENCOUNTER — Emergency Department: Payer: PPO

## 2018-10-21 ENCOUNTER — Inpatient Hospital Stay
Admission: EM | Admit: 2018-10-21 | Discharge: 2018-10-25 | DRG: 841 | Disposition: A | Discharge status: Home Health | Payer: PPO | Attending: Internal Medicine | Admitting: Internal Medicine

## 2018-10-21 ENCOUNTER — Emergency Department (HOSPITAL_COMMUNITY): Payer: PPO

## 2018-10-21 ENCOUNTER — Encounter: Payer: Self-pay | Admitting: *Deleted

## 2018-10-21 ENCOUNTER — Other Ambulatory Visit: Payer: Self-pay

## 2018-10-21 DIAGNOSIS — Z6824 Body mass index (BMI) 24.0-24.9, adult: Secondary | ICD-10-CM

## 2018-10-21 DIAGNOSIS — E871 Hypo-osmolality and hyponatremia: Secondary | ICD-10-CM | POA: Diagnosis not present

## 2018-10-21 DIAGNOSIS — E860 Dehydration: Secondary | ICD-10-CM | POA: Diagnosis present

## 2018-10-21 DIAGNOSIS — W19XXXA Unspecified fall, initial encounter: Secondary | ICD-10-CM | POA: Diagnosis present

## 2018-10-21 DIAGNOSIS — S59902A Unspecified injury of left elbow, initial encounter: Secondary | ICD-10-CM | POA: Diagnosis not present

## 2018-10-21 DIAGNOSIS — G893 Neoplasm related pain (acute) (chronic): Secondary | ICD-10-CM | POA: Diagnosis not present

## 2018-10-21 DIAGNOSIS — Z7982 Long term (current) use of aspirin: Secondary | ICD-10-CM | POA: Diagnosis not present

## 2018-10-21 DIAGNOSIS — Z8673 Personal history of transient ischemic attack (TIA), and cerebral infarction without residual deficits: Secondary | ICD-10-CM | POA: Diagnosis not present

## 2018-10-21 DIAGNOSIS — E7800 Pure hypercholesterolemia, unspecified: Secondary | ICD-10-CM | POA: Diagnosis not present

## 2018-10-21 DIAGNOSIS — Z79899 Other long term (current) drug therapy: Secondary | ICD-10-CM

## 2018-10-21 DIAGNOSIS — E440 Moderate protein-calorie malnutrition: Secondary | ICD-10-CM | POA: Diagnosis not present

## 2018-10-21 DIAGNOSIS — K219 Gastro-esophageal reflux disease without esophagitis: Secondary | ICD-10-CM | POA: Diagnosis not present

## 2018-10-21 DIAGNOSIS — G9520 Unspecified cord compression: Secondary | ICD-10-CM | POA: Diagnosis not present

## 2018-10-21 DIAGNOSIS — M7989 Other specified soft tissue disorders: Secondary | ICD-10-CM

## 2018-10-21 DIAGNOSIS — M532X9 Spinal instabilities, site unspecified: Secondary | ICD-10-CM | POA: Diagnosis present

## 2018-10-21 DIAGNOSIS — S3993XA Unspecified injury of pelvis, initial encounter: Secondary | ICD-10-CM | POA: Diagnosis not present

## 2018-10-21 DIAGNOSIS — Z808 Family history of malignant neoplasm of other organs or systems: Secondary | ICD-10-CM | POA: Diagnosis not present

## 2018-10-21 DIAGNOSIS — I10 Essential (primary) hypertension: Secondary | ICD-10-CM | POA: Diagnosis present

## 2018-10-21 DIAGNOSIS — D492 Neoplasm of unspecified behavior of bone, soft tissue, and skin: Secondary | ICD-10-CM | POA: Diagnosis not present

## 2018-10-21 DIAGNOSIS — M549 Dorsalgia, unspecified: Secondary | ICD-10-CM | POA: Diagnosis not present

## 2018-10-21 DIAGNOSIS — Z882 Allergy status to sulfonamides status: Secondary | ICD-10-CM

## 2018-10-21 DIAGNOSIS — M25551 Pain in right hip: Secondary | ICD-10-CM | POA: Diagnosis not present

## 2018-10-21 DIAGNOSIS — M545 Low back pain, unspecified: Secondary | ICD-10-CM | POA: Diagnosis present

## 2018-10-21 DIAGNOSIS — Z880 Allergy status to penicillin: Secondary | ICD-10-CM

## 2018-10-21 DIAGNOSIS — Z66 Do not resuscitate: Secondary | ICD-10-CM | POA: Diagnosis present

## 2018-10-21 DIAGNOSIS — S0990XA Unspecified injury of head, initial encounter: Secondary | ICD-10-CM | POA: Diagnosis not present

## 2018-10-21 DIAGNOSIS — M899 Disorder of bone, unspecified: Secondary | ICD-10-CM | POA: Diagnosis not present

## 2018-10-21 DIAGNOSIS — C9000 Multiple myeloma not having achieved remission: Secondary | ICD-10-CM

## 2018-10-21 DIAGNOSIS — C7951 Secondary malignant neoplasm of bone: Secondary | ICD-10-CM | POA: Diagnosis not present

## 2018-10-21 DIAGNOSIS — G459 Transient cerebral ischemic attack, unspecified: Secondary | ICD-10-CM | POA: Diagnosis not present

## 2018-10-21 DIAGNOSIS — S79911A Unspecified injury of right hip, initial encounter: Secondary | ICD-10-CM | POA: Diagnosis not present

## 2018-10-21 DIAGNOSIS — G309 Alzheimer's disease, unspecified: Secondary | ICD-10-CM | POA: Diagnosis not present

## 2018-10-21 DIAGNOSIS — J90 Pleural effusion, not elsewhere classified: Secondary | ICD-10-CM | POA: Diagnosis not present

## 2018-10-21 DIAGNOSIS — S199XXA Unspecified injury of neck, initial encounter: Secondary | ICD-10-CM | POA: Diagnosis not present

## 2018-10-21 DIAGNOSIS — E870 Hyperosmolality and hypernatremia: Secondary | ICD-10-CM | POA: Diagnosis not present

## 2018-10-21 DIAGNOSIS — E46 Unspecified protein-calorie malnutrition: Secondary | ICD-10-CM | POA: Diagnosis not present

## 2018-10-21 DIAGNOSIS — F0280 Dementia in other diseases classified elsewhere without behavioral disturbance: Secondary | ICD-10-CM | POA: Diagnosis present

## 2018-10-21 DIAGNOSIS — M8448XA Pathological fracture, other site, initial encounter for fracture: Secondary | ICD-10-CM | POA: Diagnosis not present

## 2018-10-21 LAB — COMPREHENSIVE METABOLIC PANEL
ALT: 18 U/L (ref 0–44)
AST: 22 U/L (ref 15–41)
Albumin: 3.2 g/dL — ABNORMAL LOW (ref 3.5–5.0)
Alkaline Phosphatase: 50 U/L (ref 38–126)
Anion gap: 8 (ref 5–15)
BILIRUBIN TOTAL: 0.7 mg/dL (ref 0.3–1.2)
BUN: 13 mg/dL (ref 8–23)
CHLORIDE: 108 mmol/L (ref 98–111)
CO2: 30 mmol/L (ref 22–32)
CREATININE: 1.11 mg/dL — AB (ref 0.44–1.00)
Calcium: 9.7 mg/dL (ref 8.9–10.3)
GFR calc Af Amer: 50 mL/min — ABNORMAL LOW (ref >60)
GFR calc non Af Amer: 43 mL/min — ABNORMAL LOW (ref >60)
Glucose, Bld: 107 mg/dL — ABNORMAL HIGH (ref 70–99)
Potassium: 3.7 mmol/L (ref 3.5–5.1)
Sodium: 146 mmol/L — ABNORMAL HIGH (ref 135–145)
TOTAL PROTEIN: 6.3 g/dL — AB (ref 6.5–8.1)

## 2018-10-21 LAB — CBC WITH DIFFERENTIAL/PLATELET
ABS IMMATURE GRANULOCYTES: 0.03 10*3/uL (ref 0.00–0.07)
Basophils Absolute: 0 10*3/uL (ref 0.0–0.1)
Basophils Relative: 0 %
Eosinophils Absolute: 0.3 10*3/uL (ref 0.0–0.5)
Eosinophils Relative: 6 %
HEMATOCRIT: 35.2 % — AB (ref 36.0–46.0)
Hemoglobin: 11.3 g/dL — ABNORMAL LOW (ref 12.0–15.0)
IMMATURE GRANULOCYTES: 1 %
LYMPHS ABS: 1.3 10*3/uL (ref 0.7–4.0)
Lymphocytes Relative: 22 %
MCH: 30.8 pg (ref 26.0–34.0)
MCHC: 32.1 g/dL (ref 30.0–36.0)
MCV: 95.9 fL (ref 80.0–100.0)
MONOS PCT: 8 %
Monocytes Absolute: 0.5 10*3/uL (ref 0.1–1.0)
NEUTROS ABS: 3.8 10*3/uL (ref 1.7–7.7)
NEUTROS PCT: 63 %
Platelets: 178 10*3/uL (ref 150–400)
RBC: 3.67 MIL/uL — ABNORMAL LOW (ref 3.87–5.11)
RDW: 13.4 % (ref 11.5–15.5)
WBC: 5.9 10*3/uL (ref 4.0–10.5)
nRBC: 0 % (ref 0.0–0.2)

## 2018-10-21 LAB — URINALYSIS, COMPLETE (UACMP) WITH MICROSCOPIC
BILIRUBIN URINE: NEGATIVE
Bacteria, UA: NONE SEEN
Glucose, UA: NEGATIVE mg/dL
HGB URINE DIPSTICK: NEGATIVE
Ketones, ur: NEGATIVE mg/dL
LEUKOCYTES UA: NEGATIVE
NITRITE: NEGATIVE
PH: 5 (ref 5.0–8.0)
Protein, ur: NEGATIVE mg/dL
SPECIFIC GRAVITY, URINE: 1.023 (ref 1.005–1.030)

## 2018-10-21 MED ORDER — ACETAMINOPHEN 650 MG RE SUPP: 650.0000 mg | Freq: Four times a day (QID) | RECTAL | Status: DC | PRN

## 2018-10-21 MED ORDER — ONDANSETRON HCL 4 MG/2ML IJ SOLN: 4.0000 mg | Freq: Four times a day (QID) | INTRAMUSCULAR | Status: DC | PRN

## 2018-10-21 MED ORDER — ENOXAPARIN SODIUM 40 MG/0.4ML ~~LOC~~ SOLN
40.0000 mg | SUBCUTANEOUS | Status: DC
  Administered 2018-10-21 – 2018-10-24 (×3): 40 mg via SUBCUTANEOUS
  Filled 2018-10-21 (×3): qty 0.4

## 2018-10-21 MED ORDER — TRAMADOL HCL 50 MG PO TABS
50.0000 mg | ORAL_TABLET | Freq: Four times a day (QID) | ORAL | Status: DC | PRN
  Administered 2018-10-22 – 2018-10-23 (×2): 50 mg via ORAL
  Filled 2018-10-21 (×2): qty 1

## 2018-10-21 MED ORDER — POLYETHYLENE GLYCOL 3350 17 G PO PACK: 17.0000 g | PACK | Freq: Every day | ORAL | Status: DC | PRN

## 2018-10-21 MED ORDER — MAGNESIUM OXIDE 400 (241.3 MG) MG PO TABS
200.0000 mg | ORAL_TABLET | Freq: Every day | ORAL | Status: DC
  Administered 2018-10-21 – 2018-10-25 (×5): 200 mg via ORAL
  Filled 2018-10-21 (×5): qty 1

## 2018-10-21 MED ORDER — PANTOPRAZOLE SODIUM 40 MG PO TBEC
40.0000 mg | DELAYED_RELEASE_TABLET | Freq: Every day | ORAL | Status: DC
  Administered 2018-10-21 – 2018-10-25 (×5): 40 mg via ORAL
  Filled 2018-10-21 (×5): qty 1

## 2018-10-21 MED ORDER — ACETAMINOPHEN 325 MG PO TABS
650.0000 mg | ORAL_TABLET | Freq: Four times a day (QID) | ORAL | Status: DC | PRN
  Administered 2018-10-21: 23:00:00 650 mg via ORAL
  Filled 2018-10-21: qty 2

## 2018-10-21 MED ORDER — ASPIRIN EC 81 MG PO TBEC
81.0000 mg | DELAYED_RELEASE_TABLET | Freq: Every day | ORAL | Status: DC
  Administered 2018-10-21 – 2018-10-25 (×5): 81 mg via ORAL
  Filled 2018-10-21 (×5): qty 1

## 2018-10-21 MED ORDER — GADOBUTROL 1 MMOL/ML IV SOLN
5.0000 mL | Freq: Once | INTRAVENOUS | Status: AC | PRN
  Administered 2018-10-21: 5 mL via INTRAVENOUS
  Filled 2018-10-21: qty 6

## 2018-10-21 MED ORDER — ONDANSETRON HCL 4 MG PO TABS: 4.0000 mg | ORAL_TABLET | Freq: Four times a day (QID) | ORAL | Status: DC | PRN

## 2018-10-21 MED ORDER — POTASSIUM CHLORIDE IN NACL 20-0.45 MEQ/L-% IV SOLN
INTRAVENOUS | Status: DC
  Administered 2018-10-21: 23:00:00 via INTRAVENOUS
  Filled 2018-10-21: qty 1000

## 2018-10-21 MED ORDER — DONEPEZIL HCL 5 MG PO TABS
10.0000 mg | ORAL_TABLET | Freq: Every day | ORAL | Status: DC
  Administered 2018-10-21 – 2018-10-25 (×5): 10 mg via ORAL
  Filled 2018-10-21 (×5): qty 2

## 2018-10-21 MED ORDER — ALBUTEROL SULFATE (2.5 MG/3ML) 0.083% IN NEBU: 2.5000 mg | INHALATION_SOLUTION | RESPIRATORY_TRACT | Status: DC | PRN

## 2018-10-21 NOTE — ED Notes (Signed)
Report given to Jessica RN

## 2018-10-21 NOTE — ED Provider Notes (Signed)
[facility] Emergency Department Provider Note  ____________________________________________   First MD Initiated Contact with Patient 10/21/18 1206     (approximate)  I have reviewed the triage vital signs and the nursing notes.   HISTORY  Chief Complaint Back Pain    HPI Jodye Scali is a 84 y.o. female presents emergency department after a fall last night.  Her family states it was an unwitnessed fall.  She was found sitting on the floor next to her bed.  She is complained of lower back pain and right hip pain.  Her gait has been unsteady and she has been more confused over the last couple of days.  She states she felt warm but is unsure if she had a fever.  They deny that she is had any nausea or vomiting.  Patient has Alzheimer's so she is a poor historian.    Past Medical History:  Diagnosis Date  . Alzheimer disease ([place])   . Hypercholesteremia   . Transient cerebral ischemia     There are no active problems to display for this patient.   Past Surgical History:  Procedure Laterality Date  . KNEE SURGERY      Prior to Admission medications   Medication Sig Start Date End Date Taking? Authorizing Provider  aspirin EC 81 MG tablet Take by mouth.    [provider]  b complex vitamins tablet Take by mouth.    [provider]  Calcium Carb-Cholecalciferol (CALTRATE 600+D3) 600-800 MG-UNIT TABS Take 1 tablet by mouth daily.    [provider]  Cranberry (RA CRANBERRY) 500 MG CAPS Take 1,000 mg by mouth daily.     [provider]  donepezil (ARICEPT) 10 MG tablet Take 10 mg by mouth daily.  08/21/18   [provider]  Magnesium 250 MG TABS Take 250 mg by mouth daily.    [provider]  Multiple Vitamin (MULTI-VITAMINS) TABS Take 1 tablet by mouth daily.     [provider]  omeprazole (PRILOSEC) 20 MG capsule Take 20 mg by mouth 2 (two) times daily.  08/30/18   [provider]  ranitidine (ZANTAC) 300 MG tablet Take 300 mg by mouth at bedtime.    [provider]  triamcinolone cream (KENALOG) 0.5 % Apply 1 application topically 2 (two) times daily.  10/09/17   [provider]  vitamin C (ASCORBIC ACID) 500 MG tablet Take 500 mg by mouth 2 (two) times daily.     [provider]    Allergies Penicillins and Sulfa antibiotics  History reviewed. No pertinent family history.  Social History Social History   Tobacco Use  . Smoking status: Never Smoker  . Smokeless tobacco: Never Used  Substance Use Topics  . Alcohol use: No  . Drug use: No    Review of Systems  Constitutional: No fever/chills, positive for fall Eyes: No visual changes. ENT: No sore throat. Respiratory: Denies cough Genitourinary: Negative for dysuria. Musculoskeletal: Positive for hip and for back pain. Skin: Negative for rash.    ____________________________________________   PHYSICAL EXAM:  VITAL SIGNS: ED Triage Vitals [10/21/18 1125]  Enc Vitals Group     BP (!) 128/55     Pulse      Resp 16     Temp 97.8 F (36.6 C)     Temp Source Oral     SpO2 97 %     Weight 123 lb 14.4 oz (56.2 kg)  Height      Head Circumference      Peak Flow      Pain Score      Pain Loc      Pain Edu?      Excl. in [place]?     Constitutional: Alert and oriented for the patient's baseline. Well appearing and in no acute distress.  Is able to answer questions. Eyes: Conjunctivae are normal.  Head: Atraumatic.  No skull tenderness Nose: No congestion/rhinnorhea. Mouth/Throat: Mucous membranes are moist.   Neck:  supple no lymphadenopathy noted, moderate cervical spine tenderness Cardiovascular: Normal rate, regular rhythm. Heart sounds are normal Respiratory: Normal respiratory effort.  No retractions, lungs c t a  Abd: soft nontender bs normal all 4 quad GU: deferred Musculoskeletal: FROM all extremities, warm and well perfused.  The left elbow is tender  to palpation.  The lumbar spine and right hip/femur area are tender. Neurologic:  Normal speech and language.  Skin:  Skin is warm, dry and intact. No rash noted. Psychiatric: Mood and affect are normal. Speech and behavior are normal.  ____________________________________________   LABS (all labs ordered are listed, but only abnormal results are displayed)  Labs Reviewed  URINALYSIS, COMPLETE (UACMP) WITH MICROSCOPIC - Abnormal; Notable for the following components:      Result Value   Color, Urine YELLOW (*)    APPearance CLOUDY (*)    All other components within normal limits  CBC WITH DIFFERENTIAL/PLATELET - Abnormal; Notable for the following components:   RBC 3.67 (*)    Hemoglobin 11.3 (*)    HCT 35.2 (*)    All other components within normal limits  COMPREHENSIVE METABOLIC PANEL - Abnormal; Notable for the following components:   Sodium 146 (*)    Glucose, Bld 107 (*)    Creatinine, Ser 1.11 (*)    Total Protein 6.3 (*)    Albumin 3.2 (*)    GFR calc non Af Amer 43 (*)    GFR calc Af Amer 50 (*)    All other components within normal limits  MULTIPLE MYELOMA PANEL, SERUM   ____________________________________________   ____________________________________________  RADIOLOGY  CT of the head/C-spine/lumbar spine/pelvis, shows multiple lytic lesions.  Most concerning is the soft tissue tumor at T11 that is pressing on the spinal cord.  Concerns for myeloma. X-ray of the left elbow and right femur.  The x-ray of the right femur is negative.  The x-ray of the left elbow shows a questionable radial head fracture.  ____________________________________________   PROCEDURES  Procedure(s) performed: No  Procedures    ____________________________________________   INITIAL IMPRESSION / ASSESSMENT AND PLAN / ED COURSE  Pertinent labs & imaging results that were available during my care of the patient were reviewed by me and considered in my medical decision making  (see chart for details).   Patient is an 84 year old female presents emergency department with her daughter.  The daughter states that she found her mother sitting on the floor.  It was an unwitnessed fall.  She is had an unsteady gait over the past few weeks and is been worsening.  She states that she has a history of Alzheimer's.  She does have chronic back pain but this is become worse.  She denies any other injuries at this time.  On physical exam the patient is able to answer my questions and point to areas that hurt.  She is very unsteady and needs assistance by at least 2 people to try to bear  weight.  The spine is tender to palpation.  The hips are tender of the left elbow is tender and the right femur is tender.  CT of the head, C-spine, lumbar spine and pelvis were ordered.  The radiologist, Dr. Jobe Igo called with the report stating there are multiple lytic lesions noted in the spine with most concerning being at T11 soft tissue tumor pressing on the spinal cord.  He recommends we do a complete spine MRI for metastatic screening.  Labs were ordered, CBC is basically normal conference metabolic panel is basically normal, UA is negative.  The MRI was ordered.  The MRI was difficult to read per the radiologist.  Dr. Carlis Abbott reported that the patient could not stop moving so there is much interference noted.  He states that the most concerning lesion is the T11 spinal cord compression.  He recommends we follow-up with oncology.  He states that she needs to have bone marrow testing and multiple labs performed.  Discussed this with Dr. Jimmye Norman.  He states for me to call Dr. Tasia Catchings.  I discussed this with Dr. Tasia Catchings she agreed that since the patient is unable to walk without difficulty or assistance that she should be placed in the hospital.  When I discussed this with Dr. Darvin Neighbours, Dr. Darvin Neighbours wanted me to call a neurosurgeon.  Dr. Joneen Boers from [facility] neurosurgery was consulted.  She states that this type of lesion  should receive radiation and not be surgical. Conveyed these results to Dr. Jimmye Norman.  He is going to discuss this with Dr. Tasia Catchings and Dr. Darvin Neighbours once again.  All of the results and the treatment plan so far been discussed with the family members.  The daughter states that the patient does live with her but she cannot help her walk to the bathroom.  She states she is become so unsteady it takes 2 people.  Concerns for placement are needed.   The patient was transferred to the main side of the ED.  Report was given to Dr. Jimmye Norman.  As part of my medical decision making, I reviewed the following data within the [place] History obtained from family, Nursing notes reviewed and incorporated, Labs reviewed CBC, met C, and UA are normal, Old chart reviewed, Radiograph reviewed CT the head is negative, CT of the cervical spine is negative for any acute abnormality.  However the CT of the lumbar spine and pelvis showed multiple lytic lesions.  MRI shows cord flattening due to the tumor at T11., Discussed with admitting physician Dr. Darvin Neighbours, A consult was requested and obtained from this/these consultant(s) Neurosurgery and oncology, Notes from prior ED visits and [place] Controlled Substance Database  ____________________________________________   FINAL CLINICAL IMPRESSION(S) / ED DIAGNOSES  Final diagnoses:  Metastatic multiple myeloma to bone ([place])  Fall, initial encounter      NEW MEDICATIONS STARTED DURING THIS VISIT:  New Prescriptions   No medications on file     Note:  This document was prepared using Dragon voice recognition software and may include unintentional dictation errors.    Versie Starks, PA-C 10/21/18 Pauline Aus    Harvest Dark, MD 10/22/18 1447

## 2018-10-21 NOTE — Progress Notes (Signed)
Advance care planning  Purpose of Encounter Multiple myeloma, weakness, CODE STATUS discussion  Parties in Attendance Patient, her daughter and healthcare power of attorney, Wess,Judy  Patients Decisional capacity Patient with advanced dementia.  Unable to make medical decisions.  Discussed with healthcare power of attorney daughter and son at bedside.  Patient with new diagnosis of lytic lesions and possible multiple myeloma.  Waiting for oncology input.  CODE STATUS discussed.  Patient is DNR/DNI  Time spent - 17 minutes

## 2018-10-21 NOTE — H&P (Signed)
[facility] NAME: Cynthia Lopez    MR#:  [id number]  DATE OF BIRTH:  [birth year]  DATE OF ADMISSION:  10/21/2018  PRIMARY CARE PHYSICIAN: Derinda Late, MD   REQUESTING/REFERRING PHYSICIAN: Dr. Jimmye Norman  CHIEF COMPLAINT:   Chief Complaint  Patient presents with  . Back Pain    HISTORY OF PRESENT ILLNESS:  Cynthia Lopez  is a 84 y.o. female with a known history of Alzheimer's presents to the emergency room brought in by daughter and son due to worsening weakness and falls with low back pain.  Patient has been getting increasingly weak with 2 people needed to get out of bed.  Here she had a CT scan of the spine checked which shows T11 lytic lesion with some cord compression along with other lytic lesions.  Case was discussed with oncology and with multiple myeloma patient will need further work-up.  Case was also discussed with neurosurgery who suggested possible radiation therapy and no surgery.  Patient is being admitted under observation due to increasing weakness and pain. Patient with Alzheimer's unable to contribute to history.  History obtained from daughter and son at bedside.  Old records reviewed.  PAST MEDICAL HISTORY:   Past Medical History:  Diagnosis Date  . Alzheimer disease ([place])   . Hypercholesteremia   . Transient cerebral ischemia     PAST SURGICAL HISTORY:   Past Surgical History:  Procedure Laterality Date  . KNEE SURGERY      SOCIAL HISTORY:   Social History   Tobacco Use  . Smoking status: Never Smoker  . Smokeless tobacco: Never Used  Substance Use Topics  . Alcohol use: No    FAMILY HISTORY:   Family History  Problem Relation Age of Onset  . Melanoma Brother     DRUG ALLERGIES:   Allergies  Allergen Reactions  . Penicillins   . Sulfa Antibiotics     REVIEW OF SYSTEMS:   Review of Systems  Unable to perform ROS: Dementia    MEDICATIONS AT HOME:   Prior to Admission medications    Medication Sig Start Date End Date Taking? Authorizing Provider  aspirin EC 81 MG tablet Take by mouth.    [provider]  b complex vitamins tablet Take by mouth.    [provider]  Calcium Carb-Cholecalciferol (CALTRATE 600+D3) 600-800 MG-UNIT TABS Take 1 tablet by mouth daily.    [provider]  Cranberry (RA CRANBERRY) 500 MG CAPS Take 1,000 mg by mouth daily.     [provider]  donepezil (ARICEPT) 10 MG tablet Take 10 mg by mouth daily.  08/21/18   [provider]  Magnesium 250 MG TABS Take 250 mg by mouth daily.    [provider]  Multiple Vitamin (MULTI-VITAMINS) TABS Take 1 tablet by mouth daily.     [provider]  omeprazole (PRILOSEC) 20 MG capsule Take 20 mg by mouth 2 (two) times daily.  08/30/18   [provider]  ranitidine (ZANTAC) 300 MG tablet Take 300 mg by mouth at bedtime.    [provider]  triamcinolone cream (KENALOG) 0.5 % Apply 1 application topically 2 (two) times daily.  10/09/17   [provider]  vitamin C (ASCORBIC ACID) 500 MG tablet Take 500 mg by mouth 2 (two) times daily.     [provider]     VITAL SIGNS:  Blood pressure 122/60, pulse 73, temperature 98.2 F (36.8 C), temperature source  Oral, resp. rate 20, weight 56.2 kg, SpO2 95 %.  PHYSICAL EXAMINATION:  Physical Exam  GENERAL:  84 y.o.-year-old patient lying in the bed with no acute distress.  EYES: Pupils equal, round, reactive to light and accommodation. No scleral icterus. Extraocular muscles intact.  HEENT: Head atraumatic, normocephalic. Oropharynx and nasopharynx clear. No oropharyngeal erythema, moist oral mucosa  NECK:  Supple, no jugular venous distention. No thyroid enlargement, no tenderness.  LUNGS: Normal breath sounds bilaterally, no wheezing, rales, rhonchi. No use of accessory muscles of respiration.  CARDIOVASCULAR: S1, S2 normal. No murmurs, rubs, or gallops.  ABDOMEN: Soft,  nontender, nondistended. Bowel sounds present. No organomegaly or mass.  EXTREMITIES: No pedal edema, cyanosis, or clubbing. + 2 pedal & radial pulses b/l.   NEUROLOGIC: Cranial nerves II through XII are intact. No focal Motor or sensory deficits appreciated b/l PSYCHIATRIC: The patient is alert and awake SKIN: No obvious rash, lesion, or ulcer.   LABORATORY PANEL:   CBC Recent Labs  Lab 10/21/18 1605  WBC 5.9  HGB 11.3*  HCT 35.2*  PLT 178   ------------------------------------------------------------------------------------------------------------------  Chemistries  Recent Labs  Lab 10/21/18 1605  NA 146*  K 3.7  CL 108  CO2 30  GLUCOSE 107*  BUN 13  CREATININE 1.11*  CALCIUM 9.7  AST 22  ALT 18  ALKPHOS 50  BILITOT 0.7   ------------------------------------------------------------------------------------------------------------------  Cardiac Enzymes No results for input(s): TROPONINI in the last 168 hours. ------------------------------------------------------------------------------------------------------------------  RADIOLOGY:  Dg Elbow Complete Left  Result Date: 10/21/2018 CLINICAL DATA:  84 year old female with a history of fall EXAM: LEFT ELBOW - COMPLETE 3+ VIEW COMPARISON:  None. FINDINGS: There appears to be nondisplaced fracture of the radial head. No additional fracture line. The lateral view demonstrates joint effusion with up lifting of the anterior fat pad. Osteopenia. No radiopaque foreign body.  No subcutaneous gas. IMPRESSION: Suspected nondisplaced radial head fracture with associated joint effusion. Osteopenia. Electronically Signed   By: Corrie Mckusick D.O.   On: 10/21/2018 13:56   Ct Head Wo Contrast  Result Date: 10/21/2018 CLINICAL DATA:  84 year old female with a history of fall EXAM: CT HEAD WITHOUT CONTRAST CT CERVICAL SPINE WITHOUT CONTRAST TECHNIQUE: Multidetector CT imaging of the head and cervical spine was performed following the  standard protocol without intravenous contrast. Multiplanar CT image reconstructions of the cervical spine were also generated. COMPARISON:  Head CT 07/09/2014, 03/03/2012, 05/09/2009 FINDINGS: CT HEAD FINDINGS Brain: No acute intracranial hemorrhage. No midline shift or mass effect. Gray-white differentiation is maintained. Unremarkable configuration of the ventricles. Mild volume loss. No significant periventricular white matter disease. Vascular: Calcifications of intracranial vasculature. Skull: No displaced fracture. There are small lucent lesions of the skull, which do appear to have progressed when compared to the head CT dated 05/09/2009. No scalp hematoma. Sinuses/Orbits: No acute finding. Other: None. CT CERVICAL SPINE FINDINGS Alignment: No malalignment with facets aligned and no subluxation of the vertebral bodies. Skull base and vertebrae: There is a lucent lesion in the left aspect of C1 with cortical disruption of the anterior C1 ring. There are multiple small lucent lesions throughout the cervical spine. The most pronounced outside of C1 are best seen on the sagittal reformatted images at the base of the dens C2 and the spinous process of C7. Additional lucent lesions of the upper thoracic spine vertebral bodies and posterior elements. Soft tissues and spinal canal: No canal hematoma. No adenopathy. Disc levels: Disc space narrowing is mild through the cervical spine. Most advanced disc  space narrowing at C5-C6 and C6-C7. Mild uncovertebral joint disease with no significant foraminal narrowing. Upper chest: Unremarkable appearance of the lung apices. Incomplete healing of posterior left third rib fracture. Other: None IMPRESSION: Head CT: No acute intracranial abnormality. Small lucent lesions of the skull which have progressed compared to the prior head CT. Appearance is concerning for multiple myeloma or other metastatic disease. Referral for oncologic evaluation recommended. Cervical CT: There is  a lucent lesion of the left C1 vertebral body with potential pathologic fracture. Recommend correlation with pain at the C1 level, and possibly spine surgery consult. Multiple lucent lesions throughout the cervical spine as well as upper thoracic spine. Appearance is concerning for multiple myeloma or other metastatic disease. Referral for oncologic evaluation recommended. Electronically Signed   By: Corrie Mckusick D.O.   On: 10/21/2018 14:42   Ct Cervical Spine Wo Contrast  Result Date: 10/21/2018 CLINICAL DATA:  84 year old female with a history of fall EXAM: CT HEAD WITHOUT CONTRAST CT CERVICAL SPINE WITHOUT CONTRAST TECHNIQUE: Multidetector CT imaging of the head and cervical spine was performed following the standard protocol without intravenous contrast. Multiplanar CT image reconstructions of the cervical spine were also generated. COMPARISON:  Head CT 07/09/2014, 03/03/2012, 05/09/2009 FINDINGS: CT HEAD FINDINGS Brain: No acute intracranial hemorrhage. No midline shift or mass effect. Gray-white differentiation is maintained. Unremarkable configuration of the ventricles. Mild volume loss. No significant periventricular white matter disease. Vascular: Calcifications of intracranial vasculature. Skull: No displaced fracture. There are small lucent lesions of the skull, which do appear to have progressed when compared to the head CT dated 05/09/2009. No scalp hematoma. Sinuses/Orbits: No acute finding. Other: None. CT CERVICAL SPINE FINDINGS Alignment: No malalignment with facets aligned and no subluxation of the vertebral bodies. Skull base and vertebrae: There is a lucent lesion in the left aspect of C1 with cortical disruption of the anterior C1 ring. There are multiple small lucent lesions throughout the cervical spine. The most pronounced outside of C1 are best seen on the sagittal reformatted images at the base of the dens C2 and the spinous process of C7. Additional lucent lesions of the upper  thoracic spine vertebral bodies and posterior elements. Soft tissues and spinal canal: No canal hematoma. No adenopathy. Disc levels: Disc space narrowing is mild through the cervical spine. Most advanced disc space narrowing at C5-C6 and C6-C7. Mild uncovertebral joint disease with no significant foraminal narrowing. Upper chest: Unremarkable appearance of the lung apices. Incomplete healing of posterior left third rib fracture. Other: None IMPRESSION: Head CT: No acute intracranial abnormality. Small lucent lesions of the skull which have progressed compared to the prior head CT. Appearance is concerning for multiple myeloma or other metastatic disease. Referral for oncologic evaluation recommended. Cervical CT: There is a lucent lesion of the left C1 vertebral body with potential pathologic fracture. Recommend correlation with pain at the C1 level, and possibly spine surgery consult. Multiple lucent lesions throughout the cervical spine as well as upper thoracic spine. Appearance is concerning for multiple myeloma or other metastatic disease. Referral for oncologic evaluation recommended. Electronically Signed   By: Corrie Mckusick D.O.   On: 10/21/2018 14:42   Ct Lumbar Spine Wo Contrast  Result Date: 10/21/2018 CLINICAL DATA:  Unwitnessed fall. Unable to stand. Back pain, worse than usual. EXAM: CT LUMBAR SPINE WITHOUT CONTRAST TECHNIQUE: Multidetector CT imaging of the lumbar spine was performed without intravenous contrast administration. Multiplanar CT image reconstructions were also generated. COMPARISON:  CT of the abdomen and pelvis  09/15/2018 FINDINGS: Segmentation: 5 fully formed vertebral bodies are present. The lowest fully formed vertebral body is L5 Alignment: Slight retrolisthesis is present at L1-2 and L2-3, stable. Rightward curvature is centered at L1-2. Vertebrae: Moderate osteopenia is present. There is a diffuse heterogeneous pattern of the marrow. A destructive lesion is present in the  posterior elements on the left at T11. This is only partially imaged. Tumor extends into the spinal canal. Tumor likely extends into the left T10-11 foramen. Endplate sclerotic changes are present on the left at L2-3 greater than L3-4. No other definite destructive lytic lesions are present in the lumbar spine. There is a lesion extending into the right iliac bone. Soft tissue tumor extends laterally into the gluteus musculature. Other lesions are suspected within the heterogeneity. Paraspinal and other soft tissues: Atherosclerotic calcifications are present in the aorta without aneurysm. A stable cyst is present in the left lobe of the liver. Solid organs are otherwise unremarkable. No significant adenopathy is present. Disc levels: T12-L1: No significant stenosis. L1-2: Asymmetric left-sided facet hypertrophy is present. Mild left foraminal narrowing is present uncovering of the disc contributes. L2-3: There is uncovering of a broad-based disc protrusion. Moderate facet hypertrophy contributes. Mild foraminal narrowing is worse on the left. L3-4: A broad-based disc protrusion is present. Moderate facet hypertrophy and ligamentum flavum thickening is noted. Central canal is patent. Mild foraminal narrowing is evident bilaterally. L4-5: A broad-based disc protrusion is present. Moderate facet hypertrophy and ligamentum flavum thickening contribute to mild central canal stenosis with subarticular narrowing bilaterally. Mild foraminal narrowing is also present bilaterally. L5-S1: Mild disc bulging is present. There is mild facet hypertrophy bilaterally. No significant stenosis is present. IMPRESSION: 1. Lytic soft tissue tumor involving the posterior elements on the left at T11 invasion into the spinal canal. Margin of tumor cannot be clearly identified on this noncontrast CT. Recommend MRI cervical, thoracic, and lumbar spine without and with contrast further evaluation of metastatic disease. No definite primary is  evident. 2. Soft tissue tumor with lysis of the right iliac bone extends into the right gluteal musculature. 3. Diffuse heterogeneous marrow signal likely represents osteopenia. Other small metastatic lesions are not excluded. 4. Mild foraminal narrowing at L1-2, L2-3, and L3-4. 5. Mild central canal and foraminal narrowing bilaterally at L4-5. These results were called by telephone at the time of interpretation on 10/21/2018 at 2:19 pm to [facility] , who verbally acknowledged these results. Electronically Signed   By: San Morelle M.D.   On: 10/21/2018 14:20   Ct Pelvis Wo Contrast  Result Date: 10/21/2018 CLINICAL DATA:  Un witnessed fall with pelvic pain, initial encounter EXAM: CT PELVIS WITHOUT CONTRAST TECHNIQUE: Multidetector CT imaging of the pelvis was performed following the standard protocol without intravenous contrast. COMPARISON:  09/15/2018 FINDINGS: Urinary Tract:  Bladder is decompressed. Bowel: No obstructive or inflammatory changes of the bowel are noted. The appendix is not well visualized although no inflammatory changes are seen. Vascular/Lymphatic: Atherosclerotic calcifications are noted without aneurysmal dilatation. No lymphadenopathy is seen. Reproductive: Bulky uterus with calcifications consistent with fibroid change. This is stable from the prior exam. Other:  None Musculoskeletal: Generalized osteopenia of the bony structures is noted. His somewhat mottled appearance to the bony structures is noted raising suspicion for underlying bony process. In right iliac bone there is a lytic lesion identified with cortical breakdown and soft tissue mass show she aided. The soft tissue component measures approximately 5.5 by 3.5 cm. It communicates with the lytic lesion seen in  the right iliac bone. No other similar bony lesion is seen. No acute fracture is noted IMPRESSION: No evidence of acute fracture. Lytic lesion within the right iliac bone with associated soft tissue component  extending into the right buttock as described. These changes may represent metastatic disease although the possibility of multiple myeloma deserves consideration as well. A somewhat mottled appearance to the bones is noted suggestive of myeloma. Clinical correlation is recommended. Further workup can be performed as clinically indicated. Electronically Signed   By: Inez Catalina M.D.   On: 10/21/2018 14:31   Mr Total Spine Mets Screening  Addendum Date: 10/21/2018   ADDENDUM REPORT: 10/21/2018 18:08 ADDENDUM: Findings were discussed with Ashok Cordia, PA Electronically Signed   By: Franchot Gallo M.D.   On: 10/21/2018 18:08   Result Date: 10/21/2018 CLINICAL DATA:  Cancer associated back pain.  Abnormal CT. EXAM: MRI TOTAL SPINE WITHOUT AND WITH CONTRAST TECHNIQUE: Multisequence MR imaging of the spine from the cervical spine to the sacrum was performed prior to and following IV contrast administration for evaluation of spinal metastatic disease. CONTRAST:  5 mL Gadovist IV COMPARISON:  CT cervical and lumbar spine 10/21/2018 FINDINGS: MRI CERVICAL SPINE FINDINGS Image quality degraded by extensive artifact. Unenhanced images are essentially nondiagnostic. Multiple bony lesions are best seen on the prior CT. Postcontrast images are slightly better quality and reveal enhancing mass lesion in the C2 vertebral body as well as the left C1 lateral mass. Small enhancing mass in the T1 vertebral body and T2 vertebral body. No epidural tumor or cord compression. MRI THORACIC SPINE FINDINGS Image quality degraded by extensive motion. No thoracic fracture is identified. Moderately large right-sided disc protrusion at T3-4 Enhancing tumor involving the spinous process of T11 extending into the posterior epidural space. Axial images demonstrate cord flattening and mild cord compression. Patchy enhancement T7, T8, T9, and T10, and T11 vertebral bodies. Small enhancing lesion T6 vertebral body. These are subtle lesions but  given the other findings are most likely due to tumor, probably myeloma. Small left pleural effusion MRI LUMBAR SPINE FINDINGS Image quality degraded by moderate motion. Negative for lumbar fracture. Patchy enhancement L1 and L2 vertebral bodies likely due to tumor. No epidural tumor in the lumbar canal. Degenerative retrolisthesis L1-2 and L2-3 with disc degeneration and spurring. IMPRESSION: 1. MRI of the cervical, thoracic, and lumbar spine is significantly degraded by motion. There are multiple areas of enhancing bone marrow most likely due to multiple myeloma. This is better seen on the CT scan of the cervical and lumbar spine. 2. Tumor involving the spinous process of T11 extending in the post epidural space. There is cord flattening with mild cord compression. Electronically Signed: By: Franchot Gallo M.D. On: 10/21/2018 18:04   Dg Femur Min 2 Views Right  Result Date: 10/21/2018 CLINICAL DATA:  Right-sided hip pain after fall yesterday. EXAM: RIGHT FEMUR 2 VIEWS COMPARISON:  CT abdomen pelvis dated December 15, 2018. FINDINGS: There is no evidence of fracture or other focal bone lesions. Mild right knee and hip degenerative changes. Osteopenia. Vascular calcifications. Soft tissues are unremarkable. IMPRESSION: No acute osseous abnormality. Electronically Signed   By: Titus Dubin M.D.   On: 10/21/2018 13:59   IMPRESSION AND PLAN:   *Multiple myeloma with a T11 lytic lesion with mild cord compression.   We will consult oncology.  Multiple myeloma panel ordered.  Neurosurgery consult placed and sent message to neurosurgery on-call physician for tomorrow Dr. Tommi Emery. Pain medications as needed.  Physical  therapy to see. Patient has no fracture.  Can be full weightbearing and work with physical therapy.  *Alzheimer's dementia.  Monitor for inpatient delirium.  *Dehydration with mild hyponatremia.  Start half-normal saline.  Due to decreased oral intake.  * DVT prophylaxis with  Lovenox  All the records are reviewed and case discussed with ED provider. Management plans discussed with the patient, family and they are in agreement.  CODE STATUS: FULL CODE  TOTAL TIME TAKING CARE OF THIS PATIENT: 40 minutes.   Leia Alf Zayleigh Stroh M.D on 10/21/2018 at 8:00 PM  Between 7am to 6pm - Pager - [phone]  After 6pm go to [email or web address] - password [email or web address] [facility]  Office  [phone]  CC: Primary care physician; Derinda Late, MD  Note: This dictation was prepared with Dragon dictation along with smaller phrase technology. Any transcriptional errors that result from this process are unintentional.

## 2018-10-21 NOTE — ED Notes (Signed)
Initiated call to [facility] transfer center for transport spoke to [employer]

## 2018-10-21 NOTE — ED Triage Notes (Signed)
Pt to ED reporting lower right sided back pain since a fall last night. Pt has a hx of chronic back pain that family reports has worsened since the fall. Pt has been ambulatory since the fall.

## 2018-10-21 NOTE — ED Provider Notes (Signed)
Patient was seen and examined by me.  Likely with multiple myeloma.  She has numerous lytic lesions, patient was discussed with neurosurgery who felt like she would not need any acute neurosurgical interventions.  She may need radiation at some point.  We have discussed with oncology who will see her in the hospital.  I will discuss with the hospitalist for admission.      Earleen Newport, MD 10/21/18 [phone]

## 2018-10-21 NOTE — ED Notes (Signed)
See triage note  Presents with family s/p unwitnessed fall  Family states she was found sitting on the floor  Was able to get up  Also her gait has been more unsteady over the past few weeks  Daughter states she is saying that her back hurts  Hx of chronic back pain but pain is worse

## 2018-10-21 NOTE — Progress Notes (Signed)
Discussed with ER physician Dr.Williams. Check multiple myeloma panel, free light [employer] ration.  I will see patient tomorrow.

## 2018-10-21 NOTE — Progress Notes (Signed)
Chaplain responded to a request for spiritual support. Pt was with daughter and son at bedside. Pt late husband was a [employer] park manager. Nurse said pastor will be coming by for a visit. Pt has mild dementia. Chaplain offered prayer for her care team, family and her comfort. Recommend continue following   10/21/18 2100  Clinical Encounter Type  Visited With Patient and family together  Visit Type Initial  Referral From Nurse  Spiritual Encounters  Spiritual Needs Prayer

## 2018-10-22 DIAGNOSIS — E870 Hyperosmolality and hypernatremia: Secondary | ICD-10-CM | POA: Diagnosis not present

## 2018-10-22 DIAGNOSIS — C9000 Multiple myeloma not having achieved remission: Secondary | ICD-10-CM | POA: Diagnosis not present

## 2018-10-22 DIAGNOSIS — G309 Alzheimer's disease, unspecified: Secondary | ICD-10-CM | POA: Diagnosis not present

## 2018-10-22 DIAGNOSIS — M545 Low back pain: Secondary | ICD-10-CM | POA: Diagnosis not present

## 2018-10-22 LAB — BASIC METABOLIC PANEL
Anion gap: 7 (ref 5–15)
BUN: 15 mg/dL (ref 8–23)
CALCIUM: 9.2 mg/dL (ref 8.9–10.3)
CO2: 30 mmol/L (ref 22–32)
CREATININE: 0.97 mg/dL (ref 0.44–1.00)
Chloride: 108 mmol/L (ref 98–111)
GFR calc Af Amer: 59 mL/min — ABNORMAL LOW (ref >60)
GFR calc non Af Amer: 51 mL/min — ABNORMAL LOW (ref >60)
GLUCOSE: 120 mg/dL — AB (ref 70–99)
Potassium: 3.9 mmol/L (ref 3.5–5.1)
SODIUM: 145 mmol/L (ref 135–145)

## 2018-10-22 LAB — CBC
HCT: 32.8 % — ABNORMAL LOW (ref 36.0–46.0)
Hemoglobin: 10.4 g/dL — ABNORMAL LOW (ref 12.0–15.0)
MCH: 30.4 pg (ref 26.0–34.0)
MCHC: 31.7 g/dL (ref 30.0–36.0)
MCV: 95.9 fL (ref 80.0–100.0)
PLATELETS: 178 10*3/uL (ref 150–400)
RBC: 3.42 MIL/uL — ABNORMAL LOW (ref 3.87–5.11)
RDW: 13.3 % (ref 11.5–15.5)
WBC: 5.6 10*3/uL (ref 4.0–10.5)
nRBC: 0 % (ref 0.0–0.2)

## 2018-10-22 MED ORDER — SODIUM CHLORIDE 0.9 % IV SOLN: INTRAVENOUS | Status: DC

## 2018-10-22 NOTE — Consult Note (Signed)
NEUROSURGERY INPATIENT CONSULT  Cynthia Lopez [facility] [birth year] [id number] 10/21/2018 0  Date of Service:  10/22/2018  Patient Care Team: Derinda Late, MD as PCP - General (Family Medicine)  Consult Requested by:  Hillary Bow, MD  Chief Complaint: Abnormal findings on CT scan and MRI  History of Present Illness: 84 year old woman with mild dementia brought to the emergency room by her daughter and son due to increasing difficulty with ambulation as well as right posterior hip/buttock pain.  The patient's daughter relates that her mother fell on Saturday and had a tough day.  Due to her increasing complaints they brought her to the emergency room yesterday.  Multiple imaging scans were obtained demonstrating lytic lesions within the spine on CT as well as enhancing lesions on MRI of the cervical, thoracic, lumbar spines.  The patient was admitted and is currently undergoing work-up and evaluation.  The patient currently denies pain in the spine as well as in the right posterior hip/buttock.  She denies neck pain.  She denies weakness.  She denies sensory change.  Past Medical History  Patient Active Problem List   Diagnosis Date Noted  . Low back pain 10/21/2018    Past Medical History:  Diagnosis Date  . Alzheimer disease ([place])   . Hypercholesteremia   . Transient cerebral ischemia      Past Surgical History:  Procedure Laterality Date  . KNEE SURGERY       Current Facility-Administered Medications:  .  acetaminophen (TYLENOL) tablet 650 mg, 650 mg, Oral, Q6H PRN, 650 mg at 10/21/18 2329 **OR** acetaminophen (TYLENOL) suppository 650 mg, 650 mg, Rectal, Q6H PRN, Sudini, Srikar, MD .  albuterol (PROVENTIL) (2.5 MG/3ML) 0.083% nebulizer solution 2.5 mg, 2.5 mg, Nebulization, Q2H PRN, Sudini, Srikar, MD .  aspirin EC tablet 81 mg, 81 mg, Oral, Daily, Sudini, Srikar, MD, 81 mg at 10/22/18 0944 .  donepezil (ARICEPT) tablet 10 mg, 10 mg, Oral, Daily, Sudini, Srikar, MD, 10 mg at  10/22/18 0943 .  enoxaparin (LOVENOX) injection 40 mg, 40 mg, Subcutaneous, Q24H, Sudini, Srikar, MD, 40 mg at 10/21/18 2330 .  magnesium oxide (MAG-OX) tablet 200 mg, 200 mg, Oral, Daily, Sudini, Srikar, MD, 200 mg at 10/22/18 0944 .  ondansetron (ZOFRAN) tablet 4 mg, 4 mg, Oral, Q6H PRN **OR** ondansetron (ZOFRAN) injection 4 mg, 4 mg, Intravenous, Q6H PRN, Sudini, Srikar, MD .  pantoprazole (PROTONIX) EC tablet 40 mg, 40 mg, Oral, Daily, Sudini, Srikar, MD, 40 mg at 10/22/18 0943 .  polyethylene glycol (MIRALAX / GLYCOLAX) packet 17 g, 17 g, Oral, Daily PRN, Sudini, Srikar, MD .  traMADol (ULTRAM) tablet 50 mg, 50 mg, Oral, Q6H PRN, Hillary Bow, MD, 50 mg at 10/22/18 0401   Allergies  Allergen Reactions  . Penicillins   . Sulfa Antibiotics      Social History   Tobacco Use  . Smoking status: Never Smoker  . Smokeless tobacco: Never Used  Substance Use Topics  . Alcohol use: No     Family History  Problem Relation Age of Onset  . Melanoma Brother      ROS: Difficult to obtain due to mild dementia   EXAMINATION  VITALS: BP (!) 110/53 (BP Location: Left Arm)   Pulse 79   Temp 98.9 F (37.2 C) (Oral)   Resp 20   Ht 5' (1.524 m)   Wt 57.2 kg   SpO2 94%   BMI 24.61 kg/m    Vitals Current Average / Min / Max  Temp    98.9 F (37.2 C)    Temp  Min: 98.2 F (36.8 C)  Max: 98.9 F (37.2 C)     BP     (!) 110/53     BP  Min: 110/53  Max: 144/89     HR    79    Pulse  Avg: 82.7  Min: 73  Max: 96     RR    20    Resp  Avg: 18.3  Min: 15  Max: 20     Sats    94 %    SpO2  Min: 94 %  Max: 98 %     Weight    57.2 kg    Admit: 56.2 kg    Intake/Output Summary (Last 24 hours) at 10/22/2018 1340 Last data filed at 10/22/2018 0944 Gross per 24 hour  Intake 515.28 ml  Output -  Net 515.28 ml    GENERAL: Elderly appearing woman; pleasant    MUSCULOSKELETAL:  Nontender cervical, thoracic, and lumbar spine Normal ROM of cervical spine Normal bulk and tone of  upper and lower extremities.   No abnormal movements or fasiculations.  HIGHER INTEGRATIVE FUNCTIONS:  Oriented to person   CRANIAL NERVES: 3rd, 4th, 6th Cranial nerves:  Pupils equal, round, react to light, full extraocular movements. 5th CN:  Intact to LT 7th CN:  Facial muscles strong and symmetric. 8th CN:  Hears finger rub well bilaterally. 9th CN:  Palate movement full and symmetric. 11th CN:  No deficit in shoulder shrug. 12th CN:  Tongue protrusion Midline.   STRENGTH:  Full and symmetric strength in upper extremities, including deltoid, biceps, triceps, brachioradialis, grasp, wrist and finger extensors, wrist flexors.   Full and symmetric lower extremity strength in hip flexors, quadriceps, ankle dorsi- and plantar-flexion.   REFLEXES: Normal 2+ biceps, triceps, brachioradialis, patellar, and Achilles bilaterally.  No Hoffman's sign No clonus Toes downgoing   SENSATION: Normal light touch throughout upper and lower extremities  LABORATORY: Lab Results  Component Value Date   NA 145 10/22/2018   K 3.9 10/22/2018   CL 108 10/22/2018   MG 1.8 08/30/2017   WBC 5.6 10/22/2018   HGB 10.4 (L) 10/22/2018   HCT 32.8 (L) 10/22/2018   INR 1.0 10/28/2014     IMAGING:  Head CT: IMPRESSION: No acute intracranial abnormality.  Small lucent lesions of the skull which have progressed compared to the prior head CT. Appearance is concerning for multiple myeloma or other metastatic disease. Referral for oncologic evaluation recommended.  Cervical CT: IMPRESSION: There is a lucent lesion of the left C1 vertebral body with potential pathologic fracture. Recommend correlation with pain at the C1 level, and possibly spine surgery consult.  Multiple lucent lesions throughout the cervical spine as well as upper thoracic spine. Appearance is concerning for multiple myeloma or other metastatic disease. Referral for oncologic evaluation recommended.  Lumbar  CT: IMPRESSION: 1. Lytic soft tissue tumor involving the posterior elements on the left at T11 invasion into the spinal canal. Margin of tumor cannot be clearly identified on this noncontrast CT. Recommend MRI cervical, thoracic, and lumbar spine without and with contrast further evaluation of metastatic disease. No definite primary is evident. 2. Soft tissue tumor with lysis of the right iliac bone extends into the right gluteal musculature. 3. Diffuse heterogeneous marrow signal likely represents osteopenia. Other small metastatic lesions are not excluded. 4. Mild foraminal narrowing at L1-2, L2-3, and L3-4. 5. Mild central canal and foraminal  narrowing bilaterally at L4-5.   Pelvis CT: IMPRESSION: No evidence of acute fracture.  Lytic lesion within the right iliac bone with associated soft tissue component extending into the right buttock as described. These changes may represent metastatic disease although the possibility of multiple myeloma deserves consideration as well. A somewhat mottled appearance to the bones is noted suggestive of myeloma. Clinical correlation is recommended. Further workup can be performed as clinically indicated.  MRI C/T/L-spine: MRI CERVICAL SPINE FINDINGS Image quality degraded by extensive artifact. Unenhanced images are essentially nondiagnostic. Multiple bony lesions are best seen on the prior CT. Postcontrast images are slightly better quality and reveal enhancing mass lesion in the C2 vertebral body as well as the left C1 lateral mass. Small enhancing mass in the T1 vertebral body and T2 vertebral body. No epidural tumor or cord compression.  MRI THORACIC SPINE FINDINGS Image quality degraded by extensive motion. No thoracic fracture is identified. Moderately large right-sided disc protrusion at T3-4  Enhancing tumor involving the spinous process of T11 extending into the posterior epidural space. Axial images demonstrate  cord flattening and mild cord compression. Patchy enhancement T7, T8, T9, and T10, and T11 vertebral bodies. Small enhancing lesion T6 vertebral body. These are subtle lesions but given the other findings are most likely due to tumor, probably myeloma.  Small left pleural effusion  MRI LUMBAR SPINE FINDINGS Image quality degraded by moderate motion. Negative for lumbar fracture. Patchy enhancement L1 and L2 vertebral bodies likely due to tumor. No epidural tumor in the lumbar canal.  Degenerative retrolisthesis L1-2 and L2-3 with disc degeneration and spurring.   ASSESSMENT AND PLAN: 84 year old woman status post fall found to have the following on diagnostic imaging concerning for diffuse multiple myeloma: 1.  Enhancing tumor involving the spinous process of T11 extending into the left pedicle as well is with epidural component causing mild cord compression. 2.  Enhancement of T1, T2, T6, T7, T8, T9, T10, and T11 vertebral bodies. 3.  Enhancement of L1 and L2 4.  Enhancing lesion in C2 as well as in left C1 lateral mass 5.  Lytic lesions involving the skull 6.  Multiple lytic lesions involving the cervical spine including left C1 lateral mass 7.  Lytic lesion involving T11 spinous process as well as left T11 posterior elements and pedicle. 8.  Osteopenia of the lumbar spine 9.  Lytic lesion involving the right iliac wing with soft tissue tumor component in the gluteus medius   Spine Instability Neoplastic Score (SINS) (0-6 stable; 7-12 potentially unstable; 13-18 unstable C1 lateral mass lesion: 6 T11 mass lesion: 6  1. Her spinal lesions are not currently considered unstable.  No current indication for surgical intervention however her lesions may progress in severity and compromise the stability of her spine in the coming weeks or months.  She will require close monitoring for this.  We will place an order for a TLSO and cervical collar for now.  I spoke with the patient's  daughter regarding the spine findings.  We discussed that in cases of severe instability surgery is sometimes required to stabilize the spine but is only offered in circumstances where the patient can withstand the stress of surgery and the post-operative rehabilitation process.  Given the patient's advanced age and early dementia, she is not an ideal candidate for extensive surgical intervention.  2. Recommend tissue diagnosis via bone marrow biopsy.  If tissue diagnosis is consistent with multiple myeloma, recommend expedited treatment by Rad Onc as this will rapidly  shrink the tumor and the epidural component.  3. Recommend Rad Onc consult.   Electronically signed by: Kathlene November, MD 10/22/2018 1:40 PM   This note was dictated using voice recognition software.  Please contact me if there are any questions regarding its content.

## 2018-10-22 NOTE — Progress Notes (Signed)
PT Cancellation Note  Patient Details Name: Cynthia Lopez MRN: [id number] DOB: 1929/05/22   Cancelled Treatment:    Reason Eval/Treat Not Completed: Other (comment).  PT consult received.  Chart reviewed.  Neurosurgery recommending/orders in chart for TLSO and aspen cervical collar.  Therapist stopped in pt's room and braces have not arrived yet.  Will re-attempt PT evaluation at a later date/time once braces have arrived (discussed with pt, pt's daughter, and pt's nurse).  Leitha Bleak, PT 10/22/18, 2:55 PM [phone]

## 2018-10-22 NOTE — Care Management Obs Status (Signed)
[place] NOTIFICATION   Patient Details  Name: Cynthia Lopez MRN: [id number] Date of Birth: 22-Dec-1928   Medicare Observation Status Notification Given:  Yes    Shelbie Ammons, RN 10/22/2018, 8:32 AM

## 2018-10-22 NOTE — Plan of Care (Signed)

## 2018-10-22 NOTE — NC FL2 (Signed)
Center LEVEL OF CARE SCREENING TOOL     IDENTIFICATION  Patient Name: Cynthia Lopez Birthdate: 21-Oct-1929 Sex: female Admission Date (Current Location): 10/21/2018  [place] and [place] Number:  Engineering geologist and Address:  [facility], [address] [place], [place] [address]      Provider Number: [id number]  Attending Physician Name and Address:  Hillary Bow, MD  Relative Name and Phone Number:  Gracy Racer- daughter [phone]    Current Level of Care: Hospital Recommended Level of Care: [place] Prior Approval Number:    Date Approved/Denied:   PASRR Number: [id number]  Discharge Plan: SNF    Current Diagnoses: Patient Active Problem List   Diagnosis Date Noted  . Low back pain 10/21/2018    Orientation RESPIRATION BLADDER Height & Weight     Self, Place  Normal Incontinent Weight: 126 lb (57.2 kg) Height:  5' (152.4 cm)  BEHAVIORAL SYMPTOMS/MOOD NEUROLOGICAL BOWEL NUTRITION STATUS  (none) (none) Continent Diet(Regular Diet )  AMBULATORY STATUS COMMUNICATION OF NEEDS Skin   Extensive Assist Verbally Normal                       Personal Care Assistance Level of Assistance  Bathing, Feeding, Dressing Bathing Assistance: Limited assistance Feeding assistance: Independent Dressing Assistance: Limited assistance     Functional Limitations Info  Sight, Hearing, Speech Sight Info: Adequate Hearing Info: Adequate Speech Info: Adequate    SPECIAL CARE FACTORS FREQUENCY  PT (By licensed PT), OT (By licensed OT)     PT Frequency: 5 OT Frequency: 5            Contractures Contractures Info: Not present    Additional Factors Info  Code Status, Allergies Code Status Info: DNR Allergies Info: Penicillins, Sulfa Antibiotics           Current Medications (10/22/2018):  This is the current hospital active medication list Current Facility-Administered Medications  Medication  Dose Route Frequency Provider Last Rate Last Dose  . acetaminophen (TYLENOL) tablet 650 mg  650 mg Oral Q6H PRN Hillary Bow, MD   650 mg at 10/21/18 2329   Or  . acetaminophen (TYLENOL) suppository 650 mg  650 mg Rectal Q6H PRN Sudini, Alveta Heimlich, MD      . albuterol (PROVENTIL) (2.5 MG/3ML) 0.083% nebulizer solution 2.5 mg  2.5 mg Nebulization Q2H PRN Sudini, Srikar, MD      . aspirin EC tablet 81 mg  81 mg Oral Daily Hillary Bow, MD   81 mg at 10/22/18 0944  . donepezil (ARICEPT) tablet 10 mg  10 mg Oral Daily Hillary Bow, MD   10 mg at 10/22/18 0943  . enoxaparin (LOVENOX) injection 40 mg  40 mg Subcutaneous Q24H Hillary Bow, MD   40 mg at 10/21/18 2330  . magnesium oxide (MAG-OX) tablet 200 mg  200 mg Oral Daily Hillary Bow, MD   200 mg at 10/22/18 0944  . ondansetron (ZOFRAN) tablet 4 mg  4 mg Oral Q6H PRN Hillary Bow, MD       Or  . ondansetron (ZOFRAN) injection 4 mg  4 mg Intravenous Q6H PRN Sudini, Srikar, MD      . pantoprazole (PROTONIX) EC tablet 40 mg  40 mg Oral Daily Hillary Bow, MD   40 mg at 10/22/18 0943  . polyethylene glycol (MIRALAX / GLYCOLAX) packet 17 g  17 g Oral Daily PRN Hillary Bow, MD      . traMADol (  ULTRAM) tablet 50 mg  50 mg Oral Q6H PRN Hillary Bow, MD   50 mg at 10/22/18 0401     Discharge Medications: Please see discharge summary for a list of discharge medications.  Relevant Imaging Results:  Relevant Lab Results:   Additional Information SSN: [id number]  Annamaria Boots, [place]

## 2018-10-22 NOTE — Care Management Note (Signed)
Case Management Note  Patient Details  Name: Cynthia Lopez MRN: [id number] Date of Birth: 10-23-1929  Subjective/Objective:  Admitted to [facility] under observation status with there diagnosis of low pain pain. Daughter Bethena Roys lives in the home [phone]). Sees Dr. Loney Hering every 6 months. Prescriptions are filled at [facility] in [place].  Home Health per [facility] 2-3 years ago following knee problems. [facility] in the past for skilled facility. No home oxygen. Rolling walker, cane, wheelchair, and crutches available if needed. Self feed, daughter helps with dressing and baths, [place] prior to this admission, Decreased appetite.                   Action/Plan: Physical therapy evaluation pending. Will continue to follow for plans.    Expected Discharge Date:                  Expected Discharge Plan:     In-House Referral:    yes Discharge planning Services   yes  Post Acute Care Choice:    Choice offered to:     DME Arranged:    DME Agency:     HH Arranged:    HH Agency:     Status of Service:     If discussed at [employer] of Stay Meetings, dates discussed:    Additional Comments:  Shelbie Ammons, RN MSN CCM Care Management [phone] 10/22/2018, 8:54 AM

## 2018-10-22 NOTE — Clinical Social Work Note (Signed)
Clinical Social Work Assessment  Patient Details  Name: Cynthia Lopez MRN: [id number] Date of Birth: 1929/03/03  Date of referral:  10/22/18               Reason for consult:  Facility Placement                Permission sought to share information with:  Case Manager, Customer service manager, Family Supports Permission granted to share information::  Yes, Verbal Permission Granted  Name::      SNF  Agency::   [facility]  Relationship::     Contact Information:     Housing/Transportation Living arrangements for the past 2 months:  [place] of Information:  Patient Patient Interpreter Needed:  None Criminal Activity/Legal Involvement Pertinent to Current Situation/Hospitalization:  No - Comment as needed Significant Relationships:  Adult Children Lives with:  Adult Children Do you feel safe going back to the place where you live?  Yes Need for family participation in patient care:  Yes (Comment)  Care giving concerns:  Patient lives with her daughter in [place]    Social Worker assessment / plan:  CSW consulted for SNF placement. CSW met with patient and daughter at bedside. CSW introduced self and explained role. CSW explained that PT has not seen patient yet but the MD is anticipating need for SNF. Daughter and patient are in agreement with SNF if needed. Daughter states that patient has been to [facility] in the past and that would be their preference if available. CSW explained bed search process and daughter agreed to bed search. CSW will initiate bed search and give offers once received. CSW will follow for discharge planning.   Employment status:  Retired Nurse, adult PT Recommendations:  Not assessed at this time Information / Referral to community resources:  [place]  Patient/Family's Response to care:  Patient and daughter thanked CSW for assistance   Patient/Family's Understanding of and  Emotional Response to Diagnosis, Current Treatment, and Prognosis:  Patient and daughter in agreement with discharge plan   Emotional Assessment Appearance:  Appears stated age Attitude/Demeanor/Rapport:    Affect (typically observed):  Pleasant, Quiet Orientation:  Oriented to Self, Oriented to Place Alcohol / Substance use:  Not Applicable Psych involvement (Current and /or in the community):  No (Comment)  Discharge Needs  Concerns to be addressed:  Discharge Planning Concerns Readmission within the last 30 days:  No Current discharge risk:  None Barriers to Discharge:  Continued Medical Work up   [employer], [place] 10/22/2018, 10:54 AM

## 2018-10-22 NOTE — Progress Notes (Signed)
[employer] notified of order for TLSO brace and cervical collar

## 2018-10-22 NOTE — Consult Note (Signed)
Hematology/Oncology Consult note [facility] [phone] [phone]  Patient Care Team: Derinda Late, MD as PCP - General (Family Medicine)   Name of the patient: Cynthia Lopez  [id number]  20-Jul-1929   Date of visit: 10/22/18 REASON FOR COSULTATION:  Lytic lesions History of presenting illness-  84 y.o. female with PMH listed at below, including Alzheimer's disease, who was sent to emergency room for evaluation of abdominal pain.  Patient lives with her daughter who is at the bedside. Patient cannot provide any history.  History obtained from reviewing medical charts and discussed with patient's daughter. Patient had a fall last night, it was unwitnessed.  She was found sitting on the floor next to her bed.   Daughter reported patient has been rubbing her hands on the right lower hip/back and complaining pain. Patient had a CT pelvis, CT head, lumbar spine, cervical spine done in the emergency room which showed multiple lucent lesions throughout cervical spine as well as skull, upper thoracic spine, lytic soft tissue tumor involving the posterior element of the left at the T11 invasion into the spinal canal.  Margin of tumor cannot be clearly identified on the noncontrast CT.  Recommend MRI cervical, thoracic, lumbar spine without and with contrast for further evaluation for metastatic disease.  Is also lytic lesions within the right iliac bone with associated soft tissue component extending into the right buttock,  Soft tissue tumor with lysis of the right iliac bone extends into the right gluteal musculature.  Degenerative changes  Patient's daughter is at bedside.  Per daughter, they have not officially tell patient about her image findings because patient gets nervous will get stressed.  Patient's daughter is the power of attorney.  Review of Systems  Unable to perform ROS: Medical condition     Allergies  Allergen Reactions  .  Penicillins   . Sulfa Antibiotics     Patient Active Problem List   Diagnosis Date Noted  . Low back pain 10/21/2018     Past Medical History:  Diagnosis Date  . Alzheimer disease ([place])   . Hypercholesteremia   . Transient cerebral ischemia      Past Surgical History:  Procedure Laterality Date  . KNEE SURGERY      Social History   Socioeconomic History  . Marital status: Widowed    Spouse name: Not on file  . Number of children: Not on file  . Years of education: Not on file  . Highest education level: Not on file  Occupational History  . Not on file  Social Needs  . Financial resource strain: Not on file  . Food insecurity:    Worry: Not on file    Inability: Not on file  . Transportation needs:    Medical: Not on file    Non-medical: Not on file  Tobacco Use  . Smoking status: Never Smoker  . Smokeless tobacco: Never Used  Substance and Sexual Activity  . Alcohol use: No  . Drug use: No  . Sexual activity: Not on file  Lifestyle  . Physical activity:    Days per week: Not on file    Minutes per session: Not on file  . Stress: Not on file  Relationships  . Social connections:    Talks on phone: Not on file    Gets together: Not on file    Attends religious service: Not on file    Active member of club or organization: Not on file  Attends meetings of clubs or organizations: Not on file    Relationship status: Not on file  . Intimate partner violence:    Fear of current or ex partner: Not on file    Emotionally abused: Not on file    Physically abused: Not on file    Forced sexual activity: Not on file  Other Topics Concern  . Not on file  Social History Narrative  . Not on file     Family History  Problem Relation Age of Onset  . Melanoma Brother      Current Facility-Administered Medications:  .  acetaminophen (TYLENOL) tablet 650 mg, 650 mg, Oral, Q6H PRN, 650 mg at 10/21/18 2329 **OR** acetaminophen (TYLENOL) suppository 650 mg,  650 mg, Rectal, Q6H PRN, Sudini, Srikar, MD .  albuterol (PROVENTIL) (2.5 MG/3ML) 0.083% nebulizer solution 2.5 mg, 2.5 mg, Nebulization, Q2H PRN, Sudini, Srikar, MD .  aspirin EC tablet 81 mg, 81 mg, Oral, Daily, Sudini, Srikar, MD, 81 mg at 10/21/18 2329 .  donepezil (ARICEPT) tablet 10 mg, 10 mg, Oral, Daily, Sudini, Srikar, MD, 10 mg at 10/21/18 2334 .  enoxaparin (LOVENOX) injection 40 mg, 40 mg, Subcutaneous, Q24H, Sudini, Srikar, MD, 40 mg at 10/21/18 2330 .  magnesium oxide (MAG-OX) tablet 200 mg, 200 mg, Oral, Daily, Sudini, Srikar, MD, 200 mg at 10/21/18 2329 .  ondansetron (ZOFRAN) tablet 4 mg, 4 mg, Oral, Q6H PRN **OR** ondansetron (ZOFRAN) injection 4 mg, 4 mg, Intravenous, Q6H PRN, Sudini, Srikar, MD .  pantoprazole (PROTONIX) EC tablet 40 mg, 40 mg, Oral, Daily, Sudini, Srikar, MD, 40 mg at 10/21/18 2329 .  polyethylene glycol (MIRALAX / GLYCOLAX) packet 17 g, 17 g, Oral, Daily PRN, Sudini, Srikar, MD .  traMADol Veatrice Bourbon) tablet 50 mg, 50 mg, Oral, Q6H PRN, Hillary Bow, MD, 50 mg at 10/22/18 0401   Physical exam: ECOG  Vitals:   10/21/18 1125 10/21/18 1828 10/21/18 2047 10/22/18 0415  BP: (!) 128/55 122/60 (!) 144/89 (!) 110/53  Pulse:  73 96 79  Resp: '16 20 15 20  ' Temp: 97.8 F (36.6 C) 98.2 F (36.8 C) 98.5 F (36.9 C) 98.9 F (37.2 C)  TempSrc: Oral Oral Oral Oral  SpO2: 97% 95% 98% 94%  Weight: 123 lb 14.4 oz (56.2 kg)  126 lb (57.2 kg)   Height:   5' (1.524 m)    Physical Exam  Constitutional: No distress.  Thin elderly female lying in bed without any distress.  HENT:  Head: Normocephalic and atraumatic.  Mouth/Throat: No oropharyngeal exudate.  Eyes: Pupils are equal, round, and reactive to light. EOM are normal. No scleral icterus.  Neck: Neck supple.  Cardiovascular: Normal rate and regular rhythm.  No murmur heard. Pulmonary/Chest: Effort normal. No respiratory distress. She has no rales.  Abdominal: Soft. She exhibits no distension.    Musculoskeletal: Normal range of motion. She exhibits no edema.  Neurological: She is alert. She exhibits normal muscle tone.  Oriented x1 to name.  Skin: Skin is warm and dry. She is not diaphoretic. No erythema.        CMP Latest Ref Rng & Units 10/22/2018  Glucose 70 - 99 mg/dL 120(H)  BUN 8 - 23 mg/dL 15  Creatinine 0.44 - 1.00 mg/dL 0.97  Sodium 135 - 145 mmol/L 145  Potassium 3.5 - 5.1 mmol/L 3.9  Chloride 98 - 111 mmol/L 108  CO2 22 - 32 mmol/L 30  Calcium 8.9 - 10.3 mg/dL 9.2  Total Protein 6.5 - 8.1 g/dL -  Total Bilirubin 0.3 - 1.2 mg/dL -  Alkaline Phos 38 - 126 U/L -  AST 15 - 41 U/L -  ALT 0 - 44 U/L -   CBC Latest Ref Rng & Units 10/22/2018  WBC 4.0 - 10.5 K/uL 5.6  Hemoglobin 12.0 - 15.0 g/dL 10.4(L)  Hematocrit 36.0 - 46.0 % 32.8(L)  Platelets 150 - 400 K/uL 178   RADIOGRAPHIC STUDIES: I have personally reviewed the radiological images as listed and agreed with the findings in the report.  Dg Elbow Complete Left  Result Date: 10/21/2018 CLINICAL DATA:  84 year old female with a history of fall EXAM: LEFT ELBOW - COMPLETE 3+ VIEW COMPARISON:  None. FINDINGS: There appears to be nondisplaced fracture of the radial head. No additional fracture line. The lateral view demonstrates joint effusion with up lifting of the anterior fat pad. Osteopenia. No radiopaque foreign body.  No subcutaneous gas. IMPRESSION: Suspected nondisplaced radial head fracture with associated joint effusion. Osteopenia. Electronically Signed   By: Corrie Mckusick D.O.   On: 10/21/2018 13:56   Ct Head Wo Contrast  Result Date: 10/21/2018 CLINICAL DATA:  84 year old female with a history of fall EXAM: CT HEAD WITHOUT CONTRAST CT CERVICAL SPINE WITHOUT CONTRAST TECHNIQUE: Multidetector CT imaging of the head and cervical spine was performed following the standard protocol without intravenous contrast. Multiplanar CT image reconstructions of the cervical spine were also generated. COMPARISON:  Head  CT 07/09/2014, 03/03/2012, 05/09/2009 FINDINGS: CT HEAD FINDINGS Brain: No acute intracranial hemorrhage. No midline shift or mass effect. Gray-white differentiation is maintained. Unremarkable configuration of the ventricles. Mild volume loss. No significant periventricular white matter disease. Vascular: Calcifications of intracranial vasculature. Skull: No displaced fracture. There are small lucent lesions of the skull, which do appear to have progressed when compared to the head CT dated 05/09/2009. No scalp hematoma. Sinuses/Orbits: No acute finding. Other: None. CT CERVICAL SPINE FINDINGS Alignment: No malalignment with facets aligned and no subluxation of the vertebral bodies. Skull base and vertebrae: There is a lucent lesion in the left aspect of C1 with cortical disruption of the anterior C1 ring. There are multiple small lucent lesions throughout the cervical spine. The most pronounced outside of C1 are best seen on the sagittal reformatted images at the base of the dens C2 and the spinous process of C7. Additional lucent lesions of the upper thoracic spine vertebral bodies and posterior elements. Soft tissues and spinal canal: No canal hematoma. No adenopathy. Disc levels: Disc space narrowing is mild through the cervical spine. Most advanced disc space narrowing at C5-C6 and C6-C7. Mild uncovertebral joint disease with no significant foraminal narrowing. Upper chest: Unremarkable appearance of the lung apices. Incomplete healing of posterior left third rib fracture. Other: None IMPRESSION: Head CT: No acute intracranial abnormality. Small lucent lesions of the skull which have progressed compared to the prior head CT. Appearance is concerning for multiple myeloma or other metastatic disease. Referral for oncologic evaluation recommended. Cervical CT: There is a lucent lesion of the left C1 vertebral body with potential pathologic fracture. Recommend correlation with pain at the C1 level, and possibly  spine surgery consult. Multiple lucent lesions throughout the cervical spine as well as upper thoracic spine. Appearance is concerning for multiple myeloma or other metastatic disease. Referral for oncologic evaluation recommended. Electronically Signed   By: Corrie Mckusick D.O.   On: 10/21/2018 14:42   Ct Cervical Spine Wo Contrast  Result Date: 10/21/2018 CLINICAL DATA:  84 year old female with a history of fall EXAM: CT  HEAD WITHOUT CONTRAST CT CERVICAL SPINE WITHOUT CONTRAST TECHNIQUE: Multidetector CT imaging of the head and cervical spine was performed following the standard protocol without intravenous contrast. Multiplanar CT image reconstructions of the cervical spine were also generated. COMPARISON:  Head CT 07/09/2014, 03/03/2012, 05/09/2009 FINDINGS: CT HEAD FINDINGS Brain: No acute intracranial hemorrhage. No midline shift or mass effect. Gray-white differentiation is maintained. Unremarkable configuration of the ventricles. Mild volume loss. No significant periventricular white matter disease. Vascular: Calcifications of intracranial vasculature. Skull: No displaced fracture. There are small lucent lesions of the skull, which do appear to have progressed when compared to the head CT dated 05/09/2009. No scalp hematoma. Sinuses/Orbits: No acute finding. Other: None. CT CERVICAL SPINE FINDINGS Alignment: No malalignment with facets aligned and no subluxation of the vertebral bodies. Skull base and vertebrae: There is a lucent lesion in the left aspect of C1 with cortical disruption of the anterior C1 ring. There are multiple small lucent lesions throughout the cervical spine. The most pronounced outside of C1 are best seen on the sagittal reformatted images at the base of the dens C2 and the spinous process of C7. Additional lucent lesions of the upper thoracic spine vertebral bodies and posterior elements. Soft tissues and spinal canal: No canal hematoma. No adenopathy. Disc levels: Disc space  narrowing is mild through the cervical spine. Most advanced disc space narrowing at C5-C6 and C6-C7. Mild uncovertebral joint disease with no significant foraminal narrowing. Upper chest: Unremarkable appearance of the lung apices. Incomplete healing of posterior left third rib fracture. Other: None IMPRESSION: Head CT: No acute intracranial abnormality. Small lucent lesions of the skull which have progressed compared to the prior head CT. Appearance is concerning for multiple myeloma or other metastatic disease. Referral for oncologic evaluation recommended. Cervical CT: There is a lucent lesion of the left C1 vertebral body with potential pathologic fracture. Recommend correlation with pain at the C1 level, and possibly spine surgery consult. Multiple lucent lesions throughout the cervical spine as well as upper thoracic spine. Appearance is concerning for multiple myeloma or other metastatic disease. Referral for oncologic evaluation recommended. Electronically Signed   By: Corrie Mckusick D.O.   On: 10/21/2018 14:42   Ct Lumbar Spine Wo Contrast  Result Date: 10/21/2018 CLINICAL DATA:  Unwitnessed fall. Unable to stand. Back pain, worse than usual. EXAM: CT LUMBAR SPINE WITHOUT CONTRAST TECHNIQUE: Multidetector CT imaging of the lumbar spine was performed without intravenous contrast administration. Multiplanar CT image reconstructions were also generated. COMPARISON:  CT of the abdomen and pelvis 09/15/2018 FINDINGS: Segmentation: 5 fully formed vertebral bodies are present. The lowest fully formed vertebral body is L5 Alignment: Slight retrolisthesis is present at L1-2 and L2-3, stable. Rightward curvature is centered at L1-2. Vertebrae: Moderate osteopenia is present. There is a diffuse heterogeneous pattern of the marrow. A destructive lesion is present in the posterior elements on the left at T11. This is only partially imaged. Tumor extends into the spinal canal. Tumor likely extends into the left T10-11  foramen. Endplate sclerotic changes are present on the left at L2-3 greater than L3-4. No other definite destructive lytic lesions are present in the lumbar spine. There is a lesion extending into the right iliac bone. Soft tissue tumor extends laterally into the gluteus musculature. Other lesions are suspected within the heterogeneity. Paraspinal and other soft tissues: Atherosclerotic calcifications are present in the aorta without aneurysm. A stable cyst is present in the left lobe of the liver. Solid organs are otherwise unremarkable. No significant  adenopathy is present. Disc levels: T12-L1: No significant stenosis. L1-2: Asymmetric left-sided facet hypertrophy is present. Mild left foraminal narrowing is present uncovering of the disc contributes. L2-3: There is uncovering of a broad-based disc protrusion. Moderate facet hypertrophy contributes. Mild foraminal narrowing is worse on the left. L3-4: A broad-based disc protrusion is present. Moderate facet hypertrophy and ligamentum flavum thickening is noted. Central canal is patent. Mild foraminal narrowing is evident bilaterally. L4-5: A broad-based disc protrusion is present. Moderate facet hypertrophy and ligamentum flavum thickening contribute to mild central canal stenosis with subarticular narrowing bilaterally. Mild foraminal narrowing is also present bilaterally. L5-S1: Mild disc bulging is present. There is mild facet hypertrophy bilaterally. No significant stenosis is present. IMPRESSION: 1. Lytic soft tissue tumor involving the posterior elements on the left at T11 invasion into the spinal canal. Margin of tumor cannot be clearly identified on this noncontrast CT. Recommend MRI cervical, thoracic, and lumbar spine without and with contrast further evaluation of metastatic disease. No definite primary is evident. 2. Soft tissue tumor with lysis of the right iliac bone extends into the right gluteal musculature. 3. Diffuse heterogeneous marrow signal  likely represents osteopenia. Other small metastatic lesions are not excluded. 4. Mild foraminal narrowing at L1-2, L2-3, and L3-4. 5. Mild central canal and foraminal narrowing bilaterally at L4-5. These results were called by telephone at the time of interpretation on 10/21/2018 at 2:19 pm to [facility] , who verbally acknowledged these results. Electronically Signed   By: San Morelle M.D.   On: 10/21/2018 14:20   Ct Pelvis Wo Contrast  Result Date: 10/21/2018 CLINICAL DATA:  Un witnessed fall with pelvic pain, initial encounter EXAM: CT PELVIS WITHOUT CONTRAST TECHNIQUE: Multidetector CT imaging of the pelvis was performed following the standard protocol without intravenous contrast. COMPARISON:  09/15/2018 FINDINGS: Urinary Tract:  Bladder is decompressed. Bowel: No obstructive or inflammatory changes of the bowel are noted. The appendix is not well visualized although no inflammatory changes are seen. Vascular/Lymphatic: Atherosclerotic calcifications are noted without aneurysmal dilatation. No lymphadenopathy is seen. Reproductive: Bulky uterus with calcifications consistent with fibroid change. This is stable from the prior exam. Other:  None Musculoskeletal: Generalized osteopenia of the bony structures is noted. His somewhat mottled appearance to the bony structures is noted raising suspicion for underlying bony process. In right iliac bone there is a lytic lesion identified with cortical breakdown and soft tissue mass show she aided. The soft tissue component measures approximately 5.5 by 3.5 cm. It communicates with the lytic lesion seen in the right iliac bone. No other similar bony lesion is seen. No acute fracture is noted IMPRESSION: No evidence of acute fracture. Lytic lesion within the right iliac bone with associated soft tissue component extending into the right buttock as described. These changes may represent metastatic disease although the possibility of multiple myeloma deserves  consideration as well. A somewhat mottled appearance to the bones is noted suggestive of myeloma. Clinical correlation is recommended. Further workup can be performed as clinically indicated. Electronically Signed   By: Inez Catalina M.D.   On: 10/21/2018 14:31   Mr Total Spine Mets Screening  Addendum Date: 10/21/2018   ADDENDUM REPORT: 10/21/2018 18:08 ADDENDUM: Findings were discussed with Ashok Cordia, PA Electronically Signed   By: Franchot Gallo M.D.   On: 10/21/2018 18:08   Result Date: 10/21/2018 CLINICAL DATA:  Cancer associated back pain.  Abnormal CT. EXAM: MRI TOTAL SPINE WITHOUT AND WITH CONTRAST TECHNIQUE: Multisequence MR imaging of the spine from  the cervical spine to the sacrum was performed prior to and following IV contrast administration for evaluation of spinal metastatic disease. CONTRAST:  5 mL Gadovist IV COMPARISON:  CT cervical and lumbar spine 10/21/2018 FINDINGS: MRI CERVICAL SPINE FINDINGS Image quality degraded by extensive artifact. Unenhanced images are essentially nondiagnostic. Multiple bony lesions are best seen on the prior CT. Postcontrast images are slightly better quality and reveal enhancing mass lesion in the C2 vertebral body as well as the left C1 lateral mass. Small enhancing mass in the T1 vertebral body and T2 vertebral body. No epidural tumor or cord compression. MRI THORACIC SPINE FINDINGS Image quality degraded by extensive motion. No thoracic fracture is identified. Moderately large right-sided disc protrusion at T3-4 Enhancing tumor involving the spinous process of T11 extending into the posterior epidural space. Axial images demonstrate cord flattening and mild cord compression. Patchy enhancement T7, T8, T9, and T10, and T11 vertebral bodies. Small enhancing lesion T6 vertebral body. These are subtle lesions but given the other findings are most likely due to tumor, probably myeloma. Small left pleural effusion MRI LUMBAR SPINE FINDINGS Image quality degraded  by moderate motion. Negative for lumbar fracture. Patchy enhancement L1 and L2 vertebral bodies likely due to tumor. No epidural tumor in the lumbar canal. Degenerative retrolisthesis L1-2 and L2-3 with disc degeneration and spurring. IMPRESSION: 1. MRI of the cervical, thoracic, and lumbar spine is significantly degraded by motion. There are multiple areas of enhancing bone marrow most likely due to multiple myeloma. This is better seen on the CT scan of the cervical and lumbar spine. 2. Tumor involving the spinous process of T11 extending in the post epidural space. There is cord flattening with mild cord compression. Electronically Signed: By: Franchot Gallo M.D. On: 10/21/2018 18:04   Dg Femur Min 2 Views Right  Result Date: 10/21/2018 CLINICAL DATA:  Right-sided hip pain after fall yesterday. EXAM: RIGHT FEMUR 2 VIEWS COMPARISON:  CT abdomen pelvis dated December 15, 2018. FINDINGS: There is no evidence of fracture or other focal bone lesions. Mild right knee and hip degenerative changes. Osteopenia. Vascular calcifications. Soft tissues are unremarkable. IMPRESSION: No acute osseous abnormality. Electronically Signed   By: Titus Dubin M.D.   On: 10/21/2018 13:59    Assessment and plan- Patient is a 84 y.o. female presents for evaluation of the fall.  #Multiple lytic lesions, and paraspinal soft tissue mass CT images was independently reviewed by me and discussed with patient's daughter. Suspecting underlying malignancy, especially multiple myeloma versus other solid tumor metastasis. Multiple myeloma panel and free light chain ratio pending. Discussed with neurosurgeon Dr. Tommi Emery, no surgical indication for now however her lesion may progress in severity and compromise the stability of her spine in the near future.   Once tissue diagnosis confirmed, she will need radiation oncology for palliative radiation to her spine.  Awaiting multiple myeloma labs.    She would also need a bone marrow  biopsy for further evaluation. Discussed with Radiology, right iliac soft tissue mass maybe also been included in the bone marrow biopsy.   Discussed with daughter and she is in agreement about the plan.    Thank you for allowing me to participate in the care of this patient.  Total face to face encounter time for this patient visit was 70 min. >50% of the time was  spent in counseling and coordination of care.    Earlie Server, MD, PhD Hematology Oncology [facility] Pager- [phone] 10/22/2018

## 2018-10-22 NOTE — Progress Notes (Signed)
[facility] NAME: Cynthia Lopez    MR#:  [id number]  DATE OF BIRTH:  [birth year]  SUBJECTIVE:  CHIEF COMPLAINT:   Chief Complaint  Patient presents with  . Back Pain   Low back pain on moving Daughter at bedside  Afebrile  REVIEW OF SYSTEMS:    Review of Systems  Unable to perform ROS: Dementia    DRUG ALLERGIES:   Allergies  Allergen Reactions  . Penicillins   . Sulfa Antibiotics     VITALS:  Blood pressure (!) 110/53, pulse 79, temperature 98.9 F (37.2 C), temperature source Oral, resp. rate 20, height 5' (1.524 m), weight 57.2 kg, SpO2 94 %.  PHYSICAL EXAMINATION:   Physical Exam  GENERAL:  84 y.o.-year-old patient lying in the bed with no acute distress.  EYES: Pupils equal, round, reactive to light and accommodation. No scleral icterus. Extraocular muscles intact.  HEENT: Head atraumatic, normocephalic. Oropharynx and nasopharynx clear.  NECK:  Supple, no jugular venous distention. No thyroid enlargement, no tenderness.  LUNGS: Normal breath sounds bilaterally, no wheezing, rales, rhonchi. No use of accessory muscles of respiration.  CARDIOVASCULAR: S1, S2 normal. No murmurs, rubs, or gallops.  ABDOMEN: Soft, nontender, nondistended. Bowel sounds present. No organomegaly or mass.  EXTREMITIES: No cyanosis, clubbing or edema b/l.    NEUROLOGIC: Cranial nerves II through XII are intact. No focal Motor or sensory deficits b/l.   PSYCHIATRIC: The patient is alert and awake.  Pleasantly confused SKIN: No obvious rash, lesion, or ulcer.   LABORATORY PANEL:   CBC Recent Labs  Lab 10/22/18 0535  WBC 5.6  HGB 10.4*  HCT 32.8*  PLT 178   ------------------------------------------------------------------------------------------------------------------ Chemistries  Recent Labs  Lab 10/21/18 1605 10/22/18 0535  NA 146* 145  K 3.7 3.9  CL 108 108  CO2 30 30  GLUCOSE 107* 120*  BUN 13 15  CREATININE 1.11*  0.97  CALCIUM 9.7 9.2  AST 22  --   ALT 18  --   ALKPHOS 50  --   BILITOT 0.7  --    ------------------------------------------------------------------------------------------------------------------  Cardiac Enzymes No results for input(s): TROPONINI in the last 168 hours. ------------------------------------------------------------------------------------------------------------------  RADIOLOGY:  Dg Elbow Complete Left  Result Date: 10/21/2018 CLINICAL DATA:  84 year old female with a history of fall EXAM: LEFT ELBOW - COMPLETE 3+ VIEW COMPARISON:  None. FINDINGS: There appears to be nondisplaced fracture of the radial head. No additional fracture line. The lateral view demonstrates joint effusion with up lifting of the anterior fat pad. Osteopenia. No radiopaque foreign body.  No subcutaneous gas. IMPRESSION: Suspected nondisplaced radial head fracture with associated joint effusion. Osteopenia. Electronically Signed   By: Corrie Mckusick D.O.   On: 10/21/2018 13:56   Ct Head Wo Contrast  Result Date: 10/21/2018 CLINICAL DATA:  84 year old female with a history of fall EXAM: CT HEAD WITHOUT CONTRAST CT CERVICAL SPINE WITHOUT CONTRAST TECHNIQUE: Multidetector CT imaging of the head and cervical spine was performed following the standard protocol without intravenous contrast. Multiplanar CT image reconstructions of the cervical spine were also generated. COMPARISON:  Head CT 07/09/2014, 03/03/2012, 05/09/2009 FINDINGS: CT HEAD FINDINGS Brain: No acute intracranial hemorrhage. No midline shift or mass effect. Gray-white differentiation is maintained. Unremarkable configuration of the ventricles. Mild volume loss. No significant periventricular white matter disease. Vascular: Calcifications of intracranial vasculature. Skull: No displaced fracture. There are small lucent lesions of the skull, which do appear to have progressed when compared to the head  CT dated 05/09/2009. No scalp hematoma.  Sinuses/Orbits: No acute finding. Other: None. CT CERVICAL SPINE FINDINGS Alignment: No malalignment with facets aligned and no subluxation of the vertebral bodies. Skull base and vertebrae: There is a lucent lesion in the left aspect of C1 with cortical disruption of the anterior C1 ring. There are multiple small lucent lesions throughout the cervical spine. The most pronounced outside of C1 are best seen on the sagittal reformatted images at the base of the dens C2 and the spinous process of C7. Additional lucent lesions of the upper thoracic spine vertebral bodies and posterior elements. Soft tissues and spinal canal: No canal hematoma. No adenopathy. Disc levels: Disc space narrowing is mild through the cervical spine. Most advanced disc space narrowing at C5-C6 and C6-C7. Mild uncovertebral joint disease with no significant foraminal narrowing. Upper chest: Unremarkable appearance of the lung apices. Incomplete healing of posterior left third rib fracture. Other: None IMPRESSION: Head CT: No acute intracranial abnormality. Small lucent lesions of the skull which have progressed compared to the prior head CT. Appearance is concerning for multiple myeloma or other metastatic disease. Referral for oncologic evaluation recommended. Cervical CT: There is a lucent lesion of the left C1 vertebral body with potential pathologic fracture. Recommend correlation with pain at the C1 level, and possibly spine surgery consult. Multiple lucent lesions throughout the cervical spine as well as upper thoracic spine. Appearance is concerning for multiple myeloma or other metastatic disease. Referral for oncologic evaluation recommended. Electronically Signed   By: Corrie Mckusick D.O.   On: 10/21/2018 14:42   Ct Cervical Spine Wo Contrast  Result Date: 10/21/2018 CLINICAL DATA:  84 year old female with a history of fall EXAM: CT HEAD WITHOUT CONTRAST CT CERVICAL SPINE WITHOUT CONTRAST TECHNIQUE: Multidetector CT imaging of the  head and cervical spine was performed following the standard protocol without intravenous contrast. Multiplanar CT image reconstructions of the cervical spine were also generated. COMPARISON:  Head CT 07/09/2014, 03/03/2012, 05/09/2009 FINDINGS: CT HEAD FINDINGS Brain: No acute intracranial hemorrhage. No midline shift or mass effect. Gray-white differentiation is maintained. Unremarkable configuration of the ventricles. Mild volume loss. No significant periventricular white matter disease. Vascular: Calcifications of intracranial vasculature. Skull: No displaced fracture. There are small lucent lesions of the skull, which do appear to have progressed when compared to the head CT dated 05/09/2009. No scalp hematoma. Sinuses/Orbits: No acute finding. Other: None. CT CERVICAL SPINE FINDINGS Alignment: No malalignment with facets aligned and no subluxation of the vertebral bodies. Skull base and vertebrae: There is a lucent lesion in the left aspect of C1 with cortical disruption of the anterior C1 ring. There are multiple small lucent lesions throughout the cervical spine. The most pronounced outside of C1 are best seen on the sagittal reformatted images at the base of the dens C2 and the spinous process of C7. Additional lucent lesions of the upper thoracic spine vertebral bodies and posterior elements. Soft tissues and spinal canal: No canal hematoma. No adenopathy. Disc levels: Disc space narrowing is mild through the cervical spine. Most advanced disc space narrowing at C5-C6 and C6-C7. Mild uncovertebral joint disease with no significant foraminal narrowing. Upper chest: Unremarkable appearance of the lung apices. Incomplete healing of posterior left third rib fracture. Other: None IMPRESSION: Head CT: No acute intracranial abnormality. Small lucent lesions of the skull which have progressed compared to the prior head CT. Appearance is concerning for multiple myeloma or other metastatic disease. Referral for  oncologic evaluation recommended. Cervical CT: There is  a lucent lesion of the left C1 vertebral body with potential pathologic fracture. Recommend correlation with pain at the C1 level, and possibly spine surgery consult. Multiple lucent lesions throughout the cervical spine as well as upper thoracic spine. Appearance is concerning for multiple myeloma or other metastatic disease. Referral for oncologic evaluation recommended. Electronically Signed   By: Corrie Mckusick D.O.   On: 10/21/2018 14:42   Ct Lumbar Spine Wo Contrast  Result Date: 10/21/2018 CLINICAL DATA:  Unwitnessed fall. Unable to stand. Back pain, worse than usual. EXAM: CT LUMBAR SPINE WITHOUT CONTRAST TECHNIQUE: Multidetector CT imaging of the lumbar spine was performed without intravenous contrast administration. Multiplanar CT image reconstructions were also generated. COMPARISON:  CT of the abdomen and pelvis 09/15/2018 FINDINGS: Segmentation: 5 fully formed vertebral bodies are present. The lowest fully formed vertebral body is L5 Alignment: Slight retrolisthesis is present at L1-2 and L2-3, stable. Rightward curvature is centered at L1-2. Vertebrae: Moderate osteopenia is present. There is a diffuse heterogeneous pattern of the marrow. A destructive lesion is present in the posterior elements on the left at T11. This is only partially imaged. Tumor extends into the spinal canal. Tumor likely extends into the left T10-11 foramen. Endplate sclerotic changes are present on the left at L2-3 greater than L3-4. No other definite destructive lytic lesions are present in the lumbar spine. There is a lesion extending into the right iliac bone. Soft tissue tumor extends laterally into the gluteus musculature. Other lesions are suspected within the heterogeneity. Paraspinal and other soft tissues: Atherosclerotic calcifications are present in the aorta without aneurysm. A stable cyst is present in the left lobe of the liver. Solid organs are otherwise  unremarkable. No significant adenopathy is present. Disc levels: T12-L1: No significant stenosis. L1-2: Asymmetric left-sided facet hypertrophy is present. Mild left foraminal narrowing is present uncovering of the disc contributes. L2-3: There is uncovering of a broad-based disc protrusion. Moderate facet hypertrophy contributes. Mild foraminal narrowing is worse on the left. L3-4: A broad-based disc protrusion is present. Moderate facet hypertrophy and ligamentum flavum thickening is noted. Central canal is patent. Mild foraminal narrowing is evident bilaterally. L4-5: A broad-based disc protrusion is present. Moderate facet hypertrophy and ligamentum flavum thickening contribute to mild central canal stenosis with subarticular narrowing bilaterally. Mild foraminal narrowing is also present bilaterally. L5-S1: Mild disc bulging is present. There is mild facet hypertrophy bilaterally. No significant stenosis is present. IMPRESSION: 1. Lytic soft tissue tumor involving the posterior elements on the left at T11 invasion into the spinal canal. Margin of tumor cannot be clearly identified on this noncontrast CT. Recommend MRI cervical, thoracic, and lumbar spine without and with contrast further evaluation of metastatic disease. No definite primary is evident. 2. Soft tissue tumor with lysis of the right iliac bone extends into the right gluteal musculature. 3. Diffuse heterogeneous marrow signal likely represents osteopenia. Other small metastatic lesions are not excluded. 4. Mild foraminal narrowing at L1-2, L2-3, and L3-4. 5. Mild central canal and foraminal narrowing bilaterally at L4-5. These results were called by telephone at the time of interpretation on 10/21/2018 at 2:19 pm to [facility] , who verbally acknowledged these results. Electronically Signed   By: San Morelle M.D.   On: 10/21/2018 14:20   Ct Pelvis Wo Contrast  Result Date: 10/21/2018 CLINICAL DATA:  Un witnessed fall with pelvic pain,  initial encounter EXAM: CT PELVIS WITHOUT CONTRAST TECHNIQUE: Multidetector CT imaging of the pelvis was performed following the standard protocol without intravenous contrast.  COMPARISON:  09/15/2018 FINDINGS: Urinary Tract:  Bladder is decompressed. Bowel: No obstructive or inflammatory changes of the bowel are noted. The appendix is not well visualized although no inflammatory changes are seen. Vascular/Lymphatic: Atherosclerotic calcifications are noted without aneurysmal dilatation. No lymphadenopathy is seen. Reproductive: Bulky uterus with calcifications consistent with fibroid change. This is stable from the prior exam. Other:  None Musculoskeletal: Generalized osteopenia of the bony structures is noted. His somewhat mottled appearance to the bony structures is noted raising suspicion for underlying bony process. In right iliac bone there is a lytic lesion identified with cortical breakdown and soft tissue mass show she aided. The soft tissue component measures approximately 5.5 by 3.5 cm. It communicates with the lytic lesion seen in the right iliac bone. No other similar bony lesion is seen. No acute fracture is noted IMPRESSION: No evidence of acute fracture. Lytic lesion within the right iliac bone with associated soft tissue component extending into the right buttock as described. These changes may represent metastatic disease although the possibility of multiple myeloma deserves consideration as well. A somewhat mottled appearance to the bones is noted suggestive of myeloma. Clinical correlation is recommended. Further workup can be performed as clinically indicated. Electronically Signed   By: Inez Catalina M.D.   On: 10/21/2018 14:31   Mr Total Spine Mets Screening  Addendum Date: 10/21/2018   ADDENDUM REPORT: 10/21/2018 18:08 ADDENDUM: Findings were discussed with Ashok Cordia, PA Electronically Signed   By: Franchot Gallo M.D.   On: 10/21/2018 18:08   Result Date: 10/21/2018 CLINICAL DATA:   Cancer associated back pain.  Abnormal CT. EXAM: MRI TOTAL SPINE WITHOUT AND WITH CONTRAST TECHNIQUE: Multisequence MR imaging of the spine from the cervical spine to the sacrum was performed prior to and following IV contrast administration for evaluation of spinal metastatic disease. CONTRAST:  5 mL Gadovist IV COMPARISON:  CT cervical and lumbar spine 10/21/2018 FINDINGS: MRI CERVICAL SPINE FINDINGS Image quality degraded by extensive artifact. Unenhanced images are essentially nondiagnostic. Multiple bony lesions are best seen on the prior CT. Postcontrast images are slightly better quality and reveal enhancing mass lesion in the C2 vertebral body as well as the left C1 lateral mass. Small enhancing mass in the T1 vertebral body and T2 vertebral body. No epidural tumor or cord compression. MRI THORACIC SPINE FINDINGS Image quality degraded by extensive motion. No thoracic fracture is identified. Moderately large right-sided disc protrusion at T3-4 Enhancing tumor involving the spinous process of T11 extending into the posterior epidural space. Axial images demonstrate cord flattening and mild cord compression. Patchy enhancement T7, T8, T9, and T10, and T11 vertebral bodies. Small enhancing lesion T6 vertebral body. These are subtle lesions but given the other findings are most likely due to tumor, probably myeloma. Small left pleural effusion MRI LUMBAR SPINE FINDINGS Image quality degraded by moderate motion. Negative for lumbar fracture. Patchy enhancement L1 and L2 vertebral bodies likely due to tumor. No epidural tumor in the lumbar canal. Degenerative retrolisthesis L1-2 and L2-3 with disc degeneration and spurring. IMPRESSION: 1. MRI of the cervical, thoracic, and lumbar spine is significantly degraded by motion. There are multiple areas of enhancing bone marrow most likely due to multiple myeloma. This is better seen on the CT scan of the cervical and lumbar spine. 2. Tumor involving the spinous process  of T11 extending in the post epidural space. There is cord flattening with mild cord compression. Electronically Signed: By: Franchot Gallo M.D. On: 10/21/2018 18:04   Dg  Femur Min 2 Views Right  Result Date: 10/21/2018 CLINICAL DATA:  Right-sided hip pain after fall yesterday. EXAM: RIGHT FEMUR 2 VIEWS COMPARISON:  CT abdomen pelvis dated December 15, 2018. FINDINGS: There is no evidence of fracture or other focal bone lesions. Mild right knee and hip degenerative changes. Osteopenia. Vascular calcifications. Soft tissues are unremarkable. IMPRESSION: No acute osseous abnormality. Electronically Signed   By: Titus Dubin M.D.   On: 10/21/2018 13:59     ASSESSMENT AND PLAN:   *Multiple myeloma with a T11 lytic lesion with mild cord compression.   We will consult oncology.  Multiple myeloma panel ordered.  Neurosurgery consult placed -  Dr. Tommi Emery. Pain medications as needed.  Physical therapy to see. Patient has no fracture.  Can be full weightbearing and work with physical therapy.  *Alzheimer's dementia.  Monitor for inpatient delirium.  *Dehydration  Was on IVF and improved  * DVT prophylaxis with Lovenox  All the records are reviewed and case discussed with Care Management/Social Worker Management plans discussed with the patient, family and they are in agreement.  CODE STATUS: DNR  DVT Prophylaxis: SCDs  TOTAL TIME TAKING CARE OF THIS PATIENT: 35 minutes.   POSSIBLE D/C IN 1-2 DAYS, DEPENDING ON CLINICAL CONDITION.  Leia Alf Valmai Vandenberghe M.D on 10/22/2018 at 1:53 PM  Between 7am to 6pm - Pager - [phone]  After 6pm go to [email or web address] - password [email or web address] [facility]  Office  [phone]  CC: Primary care physician; Derinda Late, MD  Note: This dictation was prepared with Dragon dictation along with smaller phrase technology. Any transcriptional errors that result from this process are unintentional.

## 2018-10-23 DIAGNOSIS — Z6824 Body mass index (BMI) 24.0-24.9, adult: Secondary | ICD-10-CM | POA: Diagnosis not present

## 2018-10-23 DIAGNOSIS — E870 Hyperosmolality and hypernatremia: Secondary | ICD-10-CM | POA: Diagnosis not present

## 2018-10-23 DIAGNOSIS — Z808 Family history of malignant neoplasm of other organs or systems: Secondary | ICD-10-CM | POA: Diagnosis not present

## 2018-10-23 DIAGNOSIS — M899 Disorder of bone, unspecified: Secondary | ICD-10-CM | POA: Diagnosis present

## 2018-10-23 DIAGNOSIS — C9000 Multiple myeloma not having achieved remission: Secondary | ICD-10-CM | POA: Diagnosis present

## 2018-10-23 DIAGNOSIS — G309 Alzheimer's disease, unspecified: Secondary | ICD-10-CM | POA: Diagnosis present

## 2018-10-23 DIAGNOSIS — W19XXXA Unspecified fall, initial encounter: Secondary | ICD-10-CM | POA: Diagnosis present

## 2018-10-23 DIAGNOSIS — Z882 Allergy status to sulfonamides status: Secondary | ICD-10-CM | POA: Diagnosis not present

## 2018-10-23 DIAGNOSIS — Z66 Do not resuscitate: Secondary | ICD-10-CM | POA: Diagnosis present

## 2018-10-23 DIAGNOSIS — Z79899 Other long term (current) drug therapy: Secondary | ICD-10-CM | POA: Diagnosis not present

## 2018-10-23 DIAGNOSIS — M545 Low back pain: Secondary | ICD-10-CM | POA: Diagnosis present

## 2018-10-23 DIAGNOSIS — I10 Essential (primary) hypertension: Secondary | ICD-10-CM | POA: Diagnosis present

## 2018-10-23 DIAGNOSIS — M532X9 Spinal instabilities, site unspecified: Secondary | ICD-10-CM | POA: Diagnosis present

## 2018-10-23 DIAGNOSIS — E46 Unspecified protein-calorie malnutrition: Secondary | ICD-10-CM | POA: Diagnosis not present

## 2018-10-23 DIAGNOSIS — E7800 Pure hypercholesterolemia, unspecified: Secondary | ICD-10-CM | POA: Diagnosis present

## 2018-10-23 DIAGNOSIS — E871 Hypo-osmolality and hyponatremia: Secondary | ICD-10-CM | POA: Diagnosis present

## 2018-10-23 DIAGNOSIS — E860 Dehydration: Secondary | ICD-10-CM | POA: Diagnosis present

## 2018-10-23 DIAGNOSIS — Z880 Allergy status to penicillin: Secondary | ICD-10-CM | POA: Diagnosis not present

## 2018-10-23 DIAGNOSIS — Z8673 Personal history of transient ischemic attack (TIA), and cerebral infarction without residual deficits: Secondary | ICD-10-CM | POA: Diagnosis not present

## 2018-10-23 DIAGNOSIS — E440 Moderate protein-calorie malnutrition: Secondary | ICD-10-CM | POA: Diagnosis present

## 2018-10-23 DIAGNOSIS — F0280 Dementia in other diseases classified elsewhere without behavioral disturbance: Secondary | ICD-10-CM | POA: Diagnosis present

## 2018-10-23 DIAGNOSIS — K219 Gastro-esophageal reflux disease without esophagitis: Secondary | ICD-10-CM | POA: Diagnosis present

## 2018-10-23 DIAGNOSIS — Z7982 Long term (current) use of aspirin: Secondary | ICD-10-CM | POA: Diagnosis not present

## 2018-10-23 DIAGNOSIS — G9520 Unspecified cord compression: Secondary | ICD-10-CM | POA: Diagnosis present

## 2018-10-23 DIAGNOSIS — G893 Neoplasm related pain (acute) (chronic): Secondary | ICD-10-CM | POA: Diagnosis present

## 2018-10-23 LAB — KAPPA/LAMBDA LIGHT CHAINS
KAPPA FREE LGHT CHN: 8.6 mg/L (ref 3.3–19.4)
KAPPA, LAMDA LIGHT CHAIN RATIO: 0.55 (ref 0.26–1.65)
LAMDA FREE LIGHT CHAINS: 15.7 mg/L (ref 5.7–26.3)

## 2018-10-23 LAB — LACTATE DEHYDROGENASE: LDH: 137 U/L (ref 98–192)

## 2018-10-23 MED ORDER — ADULT MULTIVITAMIN W/MINERALS CH
1.0000 | ORAL_TABLET | Freq: Every day | ORAL | Status: DC
  Administered 2018-10-24 – 2018-10-25 (×2): 1 via ORAL
  Filled 2018-10-23 (×2): qty 1

## 2018-10-23 MED ORDER — ENSURE ENLIVE PO LIQD
237.0000 mL | Freq: Two times a day (BID) | ORAL | Status: DC
  Administered 2018-10-23 – 2018-10-25 (×3): 237 mL via ORAL

## 2018-10-23 NOTE — Clinical Social Work Note (Signed)
CSW found in chart review that PT recommendation has changed today. PT recommendation is now home with home health. CSW spoke with patient's daughter Gracy Racer at bedside regarding improvement with therapy. Bethena Roys states that she believes patient would prefer to go home and would like to have home health. CSW notified RN CM of change in disposition. CSW also notified Tina at [facility] of change and canceled [employer] auth. CSW signing off. Please re consult if further needs arise.   [place], [place]

## 2018-10-23 NOTE — Progress Notes (Signed)
[facility] [place] NAME: Cynthia Lopez    MR#:  [id number]  DATE OF BIRTH:  09/17/1929  SUBJECTIVE:  CHIEF COMPLAINT:   Chief Complaint  Patient presents with  . Back Pain   Pleasantly confused.  Daughter at bedside. Afebrile.  REVIEW OF SYSTEMS:    Review of Systems  Unable to perform ROS: Dementia    DRUG ALLERGIES:   Allergies  Allergen Reactions  . Penicillins   . Sulfa Antibiotics     VITALS:  Blood pressure (!) 132/58, pulse 70, temperature 98.6 F (37 C), temperature source Oral, resp. rate 15, height 5' (1.524 m), weight 57.2 kg, SpO2 95 %.  PHYSICAL EXAMINATION:   Physical Exam  GENERAL:  84 y.o.-year-old patient lying in the bed with no acute distress.  EYES: Pupils equal, round, reactive to light and accommodation. No scleral icterus. Extraocular muscles intact.  HEENT: Head atraumatic, normocephalic. Oropharynx and nasopharynx clear.  NECK:  Supple, no jugular venous distention. No thyroid enlargement, no tenderness.  LUNGS: Normal breath sounds bilaterally, no wheezing, rales, rhonchi. No use of accessory muscles of respiration.  CARDIOVASCULAR: S1, S2 normal. No murmurs, rubs, or gallops.  ABDOMEN: Soft, nontender, nondistended. Bowel sounds present. No organomegaly or mass.  EXTREMITIES: No cyanosis, clubbing or edema b/l.    NEUROLOGIC: Cranial nerves II through XII are intact. No focal Motor or sensory deficits b/l.   PSYCHIATRIC: The patient is alert and awake.  Pleasantly confused SKIN: No obvious rash, lesion, or ulcer.  LABORATORY PANEL:   CBC Recent Labs  Lab 10/22/18 0535  WBC 5.6  HGB 10.4*  HCT 32.8*  PLT 178   ------------------------------------------------------------------------------------------------------------------ Chemistries  Recent Labs  Lab 10/21/18 1605 10/22/18 0535  NA 146* 145  K 3.7 3.9  CL 108 108  CO2 30 30  GLUCOSE 107* 120*  BUN 13 15  CREATININE 1.11* 0.97   CALCIUM 9.7 9.2  AST 22  --   ALT 18  --   ALKPHOS 50  --   BILITOT 0.7  --    ------------------------------------------------------------------------------------------------------------------  Cardiac Enzymes No results for input(s): TROPONINI in the last 168 hours. ------------------------------------------------------------------------------------------------------------------  RADIOLOGY:  Dg Elbow Complete Left  Result Date: 10/21/2018 CLINICAL DATA:  84 year old female with a history of fall EXAM: LEFT ELBOW - COMPLETE 3+ VIEW COMPARISON:  None. FINDINGS: There appears to be nondisplaced fracture of the radial head. No additional fracture line. The lateral view demonstrates joint effusion with up lifting of the anterior fat pad. Osteopenia. No radiopaque foreign body.  No subcutaneous gas. IMPRESSION: Suspected nondisplaced radial head fracture with associated joint effusion. Osteopenia. Electronically Signed   By: Corrie Mckusick D.O.   On: 10/21/2018 13:56   Ct Head Wo Contrast  Result Date: 10/21/2018 CLINICAL DATA:  84 year old female with a history of fall EXAM: CT HEAD WITHOUT CONTRAST CT CERVICAL SPINE WITHOUT CONTRAST TECHNIQUE: Multidetector CT imaging of the head and cervical spine was performed following the standard protocol without intravenous contrast. Multiplanar CT image reconstructions of the cervical spine were also generated. COMPARISON:  Head CT 07/09/2014, 03/03/2012, 05/09/2009 FINDINGS: CT HEAD FINDINGS Brain: No acute intracranial hemorrhage. No midline shift or mass effect. Gray-white differentiation is maintained. Unremarkable configuration of the ventricles. Mild volume loss. No significant periventricular white matter disease. Vascular: Calcifications of intracranial vasculature. Skull: No displaced fracture. There are small lucent lesions of the skull, which do appear to have progressed when compared to the head CT dated 05/09/2009. No  scalp hematoma.  Sinuses/Orbits: No acute finding. Other: None. CT CERVICAL SPINE FINDINGS Alignment: No malalignment with facets aligned and no subluxation of the vertebral bodies. Skull base and vertebrae: There is a lucent lesion in the left aspect of C1 with cortical disruption of the anterior C1 ring. There are multiple small lucent lesions throughout the cervical spine. The most pronounced outside of C1 are best seen on the sagittal reformatted images at the base of the dens C2 and the spinous process of C7. Additional lucent lesions of the upper thoracic spine vertebral bodies and posterior elements. Soft tissues and spinal canal: No canal hematoma. No adenopathy. Disc levels: Disc space narrowing is mild through the cervical spine. Most advanced disc space narrowing at C5-C6 and C6-C7. Mild uncovertebral joint disease with no significant foraminal narrowing. Upper chest: Unremarkable appearance of the lung apices. Incomplete healing of posterior left third rib fracture. Other: None IMPRESSION: Head CT: No acute intracranial abnormality. Small lucent lesions of the skull which have progressed compared to the prior head CT. Appearance is concerning for multiple myeloma or other metastatic disease. Referral for oncologic evaluation recommended. Cervical CT: There is a lucent lesion of the left C1 vertebral body with potential pathologic fracture. Recommend correlation with pain at the C1 level, and possibly spine surgery consult. Multiple lucent lesions throughout the cervical spine as well as upper thoracic spine. Appearance is concerning for multiple myeloma or other metastatic disease. Referral for oncologic evaluation recommended. Electronically Signed   By: Corrie Mckusick D.O.   On: 10/21/2018 14:42   Ct Cervical Spine Wo Contrast  Result Date: 10/21/2018 CLINICAL DATA:  84 year old female with a history of fall EXAM: CT HEAD WITHOUT CONTRAST CT CERVICAL SPINE WITHOUT CONTRAST TECHNIQUE: Multidetector CT imaging of the  head and cervical spine was performed following the standard protocol without intravenous contrast. Multiplanar CT image reconstructions of the cervical spine were also generated. COMPARISON:  Head CT 07/09/2014, 03/03/2012, 05/09/2009 FINDINGS: CT HEAD FINDINGS Brain: No acute intracranial hemorrhage. No midline shift or mass effect. Gray-white differentiation is maintained. Unremarkable configuration of the ventricles. Mild volume loss. No significant periventricular white matter disease. Vascular: Calcifications of intracranial vasculature. Skull: No displaced fracture. There are small lucent lesions of the skull, which do appear to have progressed when compared to the head CT dated 05/09/2009. No scalp hematoma. Sinuses/Orbits: No acute finding. Other: None. CT CERVICAL SPINE FINDINGS Alignment: No malalignment with facets aligned and no subluxation of the vertebral bodies. Skull base and vertebrae: There is a lucent lesion in the left aspect of C1 with cortical disruption of the anterior C1 ring. There are multiple small lucent lesions throughout the cervical spine. The most pronounced outside of C1 are best seen on the sagittal reformatted images at the base of the dens C2 and the spinous process of C7. Additional lucent lesions of the upper thoracic spine vertebral bodies and posterior elements. Soft tissues and spinal canal: No canal hematoma. No adenopathy. Disc levels: Disc space narrowing is mild through the cervical spine. Most advanced disc space narrowing at C5-C6 and C6-C7. Mild uncovertebral joint disease with no significant foraminal narrowing. Upper chest: Unremarkable appearance of the lung apices. Incomplete healing of posterior left third rib fracture. Other: None IMPRESSION: Head CT: No acute intracranial abnormality. Small lucent lesions of the skull which have progressed compared to the prior head CT. Appearance is concerning for multiple myeloma or other metastatic disease. Referral for  oncologic evaluation recommended. Cervical CT: There is a lucent lesion of  the left C1 vertebral body with potential pathologic fracture. Recommend correlation with pain at the C1 level, and possibly spine surgery consult. Multiple lucent lesions throughout the cervical spine as well as upper thoracic spine. Appearance is concerning for multiple myeloma or other metastatic disease. Referral for oncologic evaluation recommended. Electronically Signed   By: Corrie Mckusick D.O.   On: 10/21/2018 14:42   Ct Lumbar Spine Wo Contrast  Result Date: 10/21/2018 CLINICAL DATA:  Unwitnessed fall. Unable to stand. Back pain, worse than usual. EXAM: CT LUMBAR SPINE WITHOUT CONTRAST TECHNIQUE: Multidetector CT imaging of the lumbar spine was performed without intravenous contrast administration. Multiplanar CT image reconstructions were also generated. COMPARISON:  CT of the abdomen and pelvis 09/15/2018 FINDINGS: Segmentation: 5 fully formed vertebral bodies are present. The lowest fully formed vertebral body is L5 Alignment: Slight retrolisthesis is present at L1-2 and L2-3, stable. Rightward curvature is centered at L1-2. Vertebrae: Moderate osteopenia is present. There is a diffuse heterogeneous pattern of the marrow. A destructive lesion is present in the posterior elements on the left at T11. This is only partially imaged. Tumor extends into the spinal canal. Tumor likely extends into the left T10-11 foramen. Endplate sclerotic changes are present on the left at L2-3 greater than L3-4. No other definite destructive lytic lesions are present in the lumbar spine. There is a lesion extending into the right iliac bone. Soft tissue tumor extends laterally into the gluteus musculature. Other lesions are suspected within the heterogeneity. Paraspinal and other soft tissues: Atherosclerotic calcifications are present in the aorta without aneurysm. A stable cyst is present in the left lobe of the liver. Solid organs are otherwise  unremarkable. No significant adenopathy is present. Disc levels: T12-L1: No significant stenosis. L1-2: Asymmetric left-sided facet hypertrophy is present. Mild left foraminal narrowing is present uncovering of the disc contributes. L2-3: There is uncovering of a broad-based disc protrusion. Moderate facet hypertrophy contributes. Mild foraminal narrowing is worse on the left. L3-4: A broad-based disc protrusion is present. Moderate facet hypertrophy and ligamentum flavum thickening is noted. Central canal is patent. Mild foraminal narrowing is evident bilaterally. L4-5: A broad-based disc protrusion is present. Moderate facet hypertrophy and ligamentum flavum thickening contribute to mild central canal stenosis with subarticular narrowing bilaterally. Mild foraminal narrowing is also present bilaterally. L5-S1: Mild disc bulging is present. There is mild facet hypertrophy bilaterally. No significant stenosis is present. IMPRESSION: 1. Lytic soft tissue tumor involving the posterior elements on the left at T11 invasion into the spinal canal. Margin of tumor cannot be clearly identified on this noncontrast CT. Recommend MRI cervical, thoracic, and lumbar spine without and with contrast further evaluation of metastatic disease. No definite primary is evident. 2. Soft tissue tumor with lysis of the right iliac bone extends into the right gluteal musculature. 3. Diffuse heterogeneous marrow signal likely represents osteopenia. Other small metastatic lesions are not excluded. 4. Mild foraminal narrowing at L1-2, L2-3, and L3-4. 5. Mild central canal and foraminal narrowing bilaterally at L4-5. These results were called by telephone at the time of interpretation on 10/21/2018 at 2:19 pm to [facility] , who verbally acknowledged these results. Electronically Signed   By: San Morelle M.D.   On: 10/21/2018 14:20   Ct Pelvis Wo Contrast  Result Date: 10/21/2018 CLINICAL DATA:  Un witnessed fall with pelvic pain,  initial encounter EXAM: CT PELVIS WITHOUT CONTRAST TECHNIQUE: Multidetector CT imaging of the pelvis was performed following the standard protocol without intravenous contrast. COMPARISON:  09/15/2018 FINDINGS:  Urinary Tract:  Bladder is decompressed. Bowel: No obstructive or inflammatory changes of the bowel are noted. The appendix is not well visualized although no inflammatory changes are seen. Vascular/Lymphatic: Atherosclerotic calcifications are noted without aneurysmal dilatation. No lymphadenopathy is seen. Reproductive: Bulky uterus with calcifications consistent with fibroid change. This is stable from the prior exam. Other:  None Musculoskeletal: Generalized osteopenia of the bony structures is noted. His somewhat mottled appearance to the bony structures is noted raising suspicion for underlying bony process. In right iliac bone there is a lytic lesion identified with cortical breakdown and soft tissue mass show she aided. The soft tissue component measures approximately 5.5 by 3.5 cm. It communicates with the lytic lesion seen in the right iliac bone. No other similar bony lesion is seen. No acute fracture is noted IMPRESSION: No evidence of acute fracture. Lytic lesion within the right iliac bone with associated soft tissue component extending into the right buttock as described. These changes may represent metastatic disease although the possibility of multiple myeloma deserves consideration as well. A somewhat mottled appearance to the bones is noted suggestive of myeloma. Clinical correlation is recommended. Further workup can be performed as clinically indicated. Electronically Signed   By: Inez Catalina M.D.   On: 10/21/2018 14:31   Mr Total Spine Mets Screening  Addendum Date: 10/21/2018   ADDENDUM REPORT: 10/21/2018 18:08 ADDENDUM: Findings were discussed with Ashok Cordia, PA Electronically Signed   By: Franchot Gallo M.D.   On: 10/21/2018 18:08   Result Date: 10/21/2018 CLINICAL DATA:   Cancer associated back pain.  Abnormal CT. EXAM: MRI TOTAL SPINE WITHOUT AND WITH CONTRAST TECHNIQUE: Multisequence MR imaging of the spine from the cervical spine to the sacrum was performed prior to and following IV contrast administration for evaluation of spinal metastatic disease. CONTRAST:  5 mL Gadovist IV COMPARISON:  CT cervical and lumbar spine 10/21/2018 FINDINGS: MRI CERVICAL SPINE FINDINGS Image quality degraded by extensive artifact. Unenhanced images are essentially nondiagnostic. Multiple bony lesions are best seen on the prior CT. Postcontrast images are slightly better quality and reveal enhancing mass lesion in the C2 vertebral body as well as the left C1 lateral mass. Small enhancing mass in the T1 vertebral body and T2 vertebral body. No epidural tumor or cord compression. MRI THORACIC SPINE FINDINGS Image quality degraded by extensive motion. No thoracic fracture is identified. Moderately large right-sided disc protrusion at T3-4 Enhancing tumor involving the spinous process of T11 extending into the posterior epidural space. Axial images demonstrate cord flattening and mild cord compression. Patchy enhancement T7, T8, T9, and T10, and T11 vertebral bodies. Small enhancing lesion T6 vertebral body. These are subtle lesions but given the other findings are most likely due to tumor, probably myeloma. Small left pleural effusion MRI LUMBAR SPINE FINDINGS Image quality degraded by moderate motion. Negative for lumbar fracture. Patchy enhancement L1 and L2 vertebral bodies likely due to tumor. No epidural tumor in the lumbar canal. Degenerative retrolisthesis L1-2 and L2-3 with disc degeneration and spurring. IMPRESSION: 1. MRI of the cervical, thoracic, and lumbar spine is significantly degraded by motion. There are multiple areas of enhancing bone marrow most likely due to multiple myeloma. This is better seen on the CT scan of the cervical and lumbar spine. 2. Tumor involving the spinous process  of T11 extending in the post epidural space. There is cord flattening with mild cord compression. Electronically Signed: By: Franchot Gallo M.D. On: 10/21/2018 18:04   Dg Femur Min 2 Views  Right  Result Date: 10/21/2018 CLINICAL DATA:  Right-sided hip pain after fall yesterday. EXAM: RIGHT FEMUR 2 VIEWS COMPARISON:  CT abdomen pelvis dated December 15, 2018. FINDINGS: There is no evidence of fracture or other focal bone lesions. Mild right knee and hip degenerative changes. Osteopenia. Vascular calcifications. Soft tissues are unremarkable. IMPRESSION: No acute osseous abnormality. Electronically Signed   By: Titus Dubin M.D.   On: 10/21/2018 13:59     ASSESSMENT AND PLAN:   *Multiple myeloma with a T11 lytic lesion with mild cord compression.   Seen by oncology.  Waiting for CT-guided biopsy tomorrow..  Multiple myeloma panel ordered.  Neurosurgery consult placed -  Dr. Tommi Emery has seen patient.  Cervical collar and TLSO brace ordered Pain medications as needed.  Physical therapy to see. . *Alzheimer's dementia.  Monitor for inpatient delirium.  *Dehydration  Was on IVF and improved  * DVT prophylaxis with Lovenox  Left elbow undisplaced  radial fracture on x-ray.  But patient has no swelling or redness in the area.  Monitor.  All the records are reviewed and case discussed with Care Management/Social Worker Management plans discussed with the patient, family and they are in agreement.  CODE STATUS: DNR  DVT Prophylaxis: SCDs  TOTAL TIME TAKING CARE OF THIS PATIENT: 35 minutes.   Discharge tomorrow after biopsy to skilled nursing facility.  Leia Alf Adisa Vigeant M.D on 10/23/2018 at 12:05 PM  Between 7am to 6pm - Pager - [phone]  After 6pm go to [email or web address] - password [email or web address] [facility]  Office  [phone]  CC: Primary care physician; Derinda Late, MD  Note: This dictation was prepared with Dragon dictation along with smaller phrase  technology. Any transcriptional errors that result from this process are unintentional.

## 2018-10-23 NOTE — Plan of Care (Signed)

## 2018-10-23 NOTE — Progress Notes (Signed)
Initial Nutrition Assessment  DOCUMENTATION CODES:   Non-severe (moderate) malnutrition in context of chronic illness  INTERVENTION:  Continue Ensure Enlive po BID, each supplement provides 350 kcal and 20 grams of protein.  Provide Magic cup TID with meals, each supplement provides 290 kcal and 9 grams of protein.  Provide daily MVI.  NUTRITION DIAGNOSIS:   Moderate Malnutrition related to chronic illness(multiple myeloma, Alzheimer's dementia) as evidenced by moderate fat depletion, moderate muscle depletion.  GOAL:   Patient will meet greater than or equal to 90% of their needs  MONITOR:   PO intake, Supplement acceptance, Labs, Weight trends, I & O's  REASON FOR ASSESSMENT:   Malnutrition Screening Tool    ASSESSMENT:   84 year old female with PMHx of Alzheimer's dementia who presented with weakness, falls, and lower back pain found to have multiple myeloma with T11 lytic lesion with mild cord compression, dehydration.   -Plan is for CT-guided biopsy tomorrow.  Met with patient and her daughter at bedside. History provided by daughter as patient is confused. Daughter reports patient has had decreased appetite for a while now. In the morning she drinks one bottle of Ensure with her medications. She will have one meal later in the day which may be a pot pie but it is often just bread or biscuits. She is only eating bites at meals here. Patient does not like meat. She was drinking an Ensure and taking bites of Magic Cup at time of RD assessment and liked them both.  Daughter is unsure of UBW but reports she can tell patient is losing weight. Per chart patient was 60.7 kg on 08/30/2017 and was 56.2 kg on 09/15/2018. She lost 4.5 kg (7.4% body weight) over one year, which is not significant for time frame. Patient is currently 57.2 kg (126 lbs).  Medications reviewed and include: magnesium oxide 200 mg daily.  Labs reviewed.  NUTRITION - FOCUSED PHYSICAL EXAM:    Most  Recent Value  Orbital Region  Moderate depletion  Upper Arm Region  Severe depletion  Thoracic and Lumbar Region  Moderate depletion  Buccal Region  Moderate depletion  Temple Region  Severe depletion  Clavicle Bone Region  Moderate depletion  Clavicle and Acromion Bone Region  Moderate depletion  Scapular Bone Region  Moderate depletion  Dorsal Hand  Severe depletion  Patellar Region  Moderate depletion  Anterior Thigh Region  Moderate depletion  Posterior Calf Region  Moderate depletion  Edema (RD Assessment)  None  Hair  Reviewed  Eyes  Reviewed  Mouth  Reviewed  Skin  Reviewed  Nails  Reviewed     Diet Order:   Diet Order            Diet NPO time specified  Diet effective midnight        Diet regular Room service appropriate? Yes; Fluid consistency: Thin  Diet effective now             EDUCATION NEEDS:   No education needs have been identified at this time  Skin:  Skin Assessment: Reviewed RN Assessment  Last BM:  10/22/2018 - medium type 4  Height:   Ht Readings from Last 1 Encounters:  10/21/18 5' (1.524 m)   Weight:   Wt Readings from Last 1 Encounters:  10/21/18 57.2 kg   Ideal Body Weight:  45.5 kg  BMI:  Body mass index is 24.61 kg/m.  Estimated Nutritional Needs:   Kcal:  1430-1715 (25-30 kcal/kg)  Protein:  70-80 grams (  1.2-1.4 grams/kg)  Fluid:  1.4 L/day (25 mL/kg)  Willey Blade, MS, RD, LDN Office: [phone] Pager: [phone] After Hours/Weekend Pager: [phone]

## 2018-10-23 NOTE — Clinical Social Work Note (Signed)
CSW gave patient and daughter Cynthia Lopez bed offers. Cynthia Lopez chose [facility]. CSW notified Otila Kluver at [facility] that patient accepted bed offer. CSW initiated [facility]. CSW will continue to follow for discharge planning.   [place], [place]

## 2018-10-23 NOTE — Evaluation (Signed)
Physical Therapy Evaluation Patient Details Name: Cynthia Lopez MRN: [id number] DOB: 02-01-1929 Today's Date: 10/23/2018   History of Present Illness  Pt is an 84 y.o. female presenting to hospital 10/21/18 s/p unwitnessed fall (found sitting on floor) with LBP and R hip pain.  Pt with unsteady gait last few weeks and was getting worse.  Imaging showing suspected L nondisplaced radial head fx.  Also imaging showing:  "Enhancing tumor involving the spinous process of T11 extending into the left pedicle as well is with epidural component causing mild cord compression; Enhancement of T1, T2, T6, T7, T8, T9, T10, and T11 vertebral bodies; Enhancement of L1 and L2; Enhancing lesion in C2 as well as in left C1 lateral mass; Lytic lesions involving the skull; Multiple lytic lesions involving the cervical spine including left C1 lateral mass; Lytic lesion involving T11 spinous process as well as left T11 posterior elements and pedicle; Osteopenia of the lumbar spine; and Lytic lesion involving the right iliac wing with soft tissue tumor component in the gluteus medius" per Neurosurgery note.   PMH includes Alzheimer's disease, knee surgery, and transient cerebral ischemia.  Clinical Impression  Prior to hospital admission, pt was independent with ambulation.  Pt lives with her daughter in 1 level home with 5 steps to enter with B railings.  Currently pt is SBA semi-supine to sit; CGA with transfers; and CGA ambulating around nursing loop (no AD).  Pt initially mildly unsteady with ambulation (CGA provided for safety but no loss of balance noted) and improved steadiness noted with increased distance ambulating.  No c/o any pain (back, UE's, etc) throughout PT session.  Pt would benefit from skilled PT to address noted impairments and functional limitations (see below for any additional details).  Upon hospital discharge, recommend pt discharge to home with 24/7 assist for safety with mobility d/t cognitive concerns  (pt with PMH of Alzheimer's disease) and HHPT.    Follow Up Recommendations Home health PT;Supervision/Assistance - 24 hour    Equipment Recommendations  None recommended by PT    Recommendations for Other Services       Precautions / Restrictions Precautions Precautions: Fall;Cervical;Back Precaution Comments: cervical brace at all times and TLSO when OOB per discussion with Neurosurgery 10/23/18 Required Braces or Orthoses: Cervical Brace;Spinal Brace Cervical Brace: Hard collar;At all times Spinal Brace: Thoracolumbosacral orthotic;Applied in sitting position Restrictions Weight Bearing Restrictions: No Other Position/Activity Restrictions: Per discussion with MD Sudini, no current concerns regarding imaging suspecting L non-displaced radial head fx (no WB'ing precautions at this time but monitor for any symptoms)      Mobility  Bed Mobility Overal bed mobility: Needs Assistance Bed Mobility: Supine to Sit     Supine to sit: Supervision;HOB elevated     General bed mobility comments: mild increased effort for pt to perform on own  Transfers Overall transfer level: Needs assistance Equipment used: None Transfers: Sit to/from Stand Sit to Stand: Min guard         General transfer comment: fairly strong stand (no UE support) and controlled descent sitting  Ambulation/Gait Ambulation/Gait assistance: Min guard Gait Distance (Feet): 260 Feet Assistive device: None Gait Pattern/deviations: Step-through pattern Gait velocity: mildly decreased   General Gait Details: initially mildly unsteady (CGA provided for safety) but improved balance noted with increased distance ambulating  Stairs            Wheelchair Mobility    Modified Rankin (Stroke Patients Only)       Balance Overall balance assessment:  Needs assistance Sitting-balance support: No upper extremity supported;Feet supported Sitting balance-Leahy Scale: Good Sitting balance - Comments: steady  sitting reaching within BOS   Standing balance support: No upper extremity supported Standing balance-Leahy Scale: Good Standing balance comment: steady standing reaching within BOS                             Pertinent Vitals/Pain Pain Assessment: Faces Faces Pain Scale: No hurt Pain Intervention(s): Limited activity within patient's tolerance;Monitored during session;Repositioned  Vitals (HR and O2 on room air) stable and WFL throughout treatment session.    Home Living Family/patient expects to be discharged to:: Private residence Living Arrangements: Children(Pt's daughter Bethena Roys) Available Help at Discharge: Family;Available 24 hours/day Type of Home: House Home Access: Stairs to enter Entrance Stairs-Rails: Right;Left;Can reach both Entrance Stairs-Number of Steps: 5 Home Layout: One level Home Equipment: Grab bars - toilet;Walker - 4 wheels;Walker - standard;Cane - single point;Wheelchair - manual;Crutches;Tub bench      Prior Function Level of Independence: Needs assistance   Gait / Transfers Assistance Needed: Ambulatory no AD (1 recent fall)  ADL's / Homemaking Assistance Needed: assist for dressing and bathing        Hand Dominance        Extremity/Trunk Assessment   Upper Extremity Assessment Upper Extremity Assessment: Generalized weakness    Lower Extremity Assessment Lower Extremity Assessment: Generalized weakness    Cervical / Trunk Assessment Cervical / Trunk Assessment: Kyphotic  Communication   Communication: No difficulties  Cognition Arousal/Alertness: Awake/alert Behavior During Therapy: WFL for tasks assessed/performed(occasionally quick to get up) Overall Cognitive Status: (Oriented to person)                                        General Comments General comments (skin integrity, edema, etc.): Cervical collar on upon PT arrival; TLSO donned with pt sitting edge of bed beginning of session (pt's daughter  able to verbalize how to properly donn TLSO).  Nursing cleared pt for participation in physical therapy.  Pt agreeable to PT session.  Pt's daughter present entire session.    Exercises     Assessment/Plan    PT Assessment Patient needs continued PT services  PT Problem List Decreased strength;Decreased balance;Decreased mobility;Decreased activity tolerance;Decreased knowledge of use of DME;Decreased knowledge of precautions       PT Treatment Interventions DME instruction;Gait training;Stair training;Functional mobility training;Therapeutic activities;Therapeutic exercise;Balance training;Patient/family education    PT Goals (Current goals can be found in the Care Plan section)  Acute Rehab PT Goals Patient Stated Goal: to walk more PT Goal Formulation: With patient/family Time For Goal Achievement: 11/06/18 Potential to Achieve Goals: Good    Frequency Min 2X/week   Barriers to discharge        Co-evaluation               AM-PAC PT "6 Clicks" Daily Activity  Outcome Measure Difficulty turning over in bed (including adjusting bedclothes, sheets and blankets)?: A Little Difficulty moving from lying on back to sitting on the side of the bed? : A Little Difficulty sitting down on and standing up from a chair with arms (e.g., wheelchair, bedside commode, etc,.)?: Unable Help needed moving to and from a bed to chair (including a wheelchair)?: A Little Help needed walking in hospital room?: A Little Help needed climbing 3-5 steps with a railing? :  A Little 6 Click Score: 16    End of Session Equipment Utilized During Treatment: Gait belt;Back brace;Cervical collar Activity Tolerance: Patient tolerated treatment well Patient left: in chair;with call bell/phone within reach;with chair alarm set;with family/visitor present Nurse Communication: Mobility status;Precautions PT Visit Diagnosis: Other abnormalities of gait and mobility (R26.89);Muscle weakness (generalized)  (M62.81);History of falling (Z91.81);Difficulty in walking, not elsewhere classified (R26.2)    Time: 9828-6751 PT Time Calculation (min) (ACUTE ONLY): 43 min   Charges:   PT Evaluation $PT Eval Low Complexity: 1 Low PT Treatments $Therapeutic Activity: 8-22 mins       Leitha Bleak, PT 10/23/18, 2:25 PM [phone]

## 2018-10-24 ENCOUNTER — Other Ambulatory Visit (HOSPITAL_COMMUNITY)
Admission: RE | Admit: 2018-10-24 | Discharge: 2018-10-24 | Disposition: A | Discharge status: Home / Self Care | Payer: PPO | Source: Ambulatory Visit | Attending: Oncology | Admitting: Oncology

## 2018-10-24 ENCOUNTER — Inpatient Hospital Stay: Payer: PPO

## 2018-10-24 ENCOUNTER — Encounter: Payer: Self-pay | Admitting: *Deleted

## 2018-10-24 DIAGNOSIS — M545 Low back pain: Secondary | ICD-10-CM

## 2018-10-24 DIAGNOSIS — E46 Unspecified protein-calorie malnutrition: Secondary | ICD-10-CM

## 2018-10-24 DIAGNOSIS — G309 Alzheimer's disease, unspecified: Secondary | ICD-10-CM

## 2018-10-24 DIAGNOSIS — M7989 Other specified soft tissue disorders: Secondary | ICD-10-CM

## 2018-10-24 DIAGNOSIS — W19XXXA Unspecified fall, initial encounter: Secondary | ICD-10-CM

## 2018-10-24 DIAGNOSIS — G459 Transient cerebral ischemic attack, unspecified: Secondary | ICD-10-CM

## 2018-10-24 DIAGNOSIS — C9000 Multiple myeloma not having achieved remission: Principal | ICD-10-CM

## 2018-10-24 DIAGNOSIS — G893 Neoplasm related pain (acute) (chronic): Secondary | ICD-10-CM

## 2018-10-24 DIAGNOSIS — M899 Disorder of bone, unspecified: Secondary | ICD-10-CM

## 2018-10-24 DIAGNOSIS — E7800 Pure hypercholesterolemia, unspecified: Secondary | ICD-10-CM

## 2018-10-24 DIAGNOSIS — E870 Hyperosmolality and hypernatremia: Secondary | ICD-10-CM | POA: Diagnosis not present

## 2018-10-24 DIAGNOSIS — F0280 Dementia in other diseases classified elsewhere without behavioral disturbance: Secondary | ICD-10-CM

## 2018-10-24 DIAGNOSIS — Z79899 Other long term (current) drug therapy: Secondary | ICD-10-CM

## 2018-10-24 DIAGNOSIS — E440 Moderate protein-calorie malnutrition: Secondary | ICD-10-CM

## 2018-10-24 LAB — CBC WITH DIFFERENTIAL/PLATELET
ABS IMMATURE GRANULOCYTES: 0.04 10*3/uL (ref 0.00–0.07)
BASOS ABS: 0 10*3/uL (ref 0.0–0.1)
BASOS PCT: 0 %
Eosinophils Absolute: 0.2 10*3/uL (ref 0.0–0.5)
Eosinophils Relative: 5 %
HCT: 31 % — ABNORMAL LOW (ref 36.0–46.0)
Hemoglobin: 10 g/dL — ABNORMAL LOW (ref 12.0–15.0)
Immature Granulocytes: 1 %
LYMPHS PCT: 23 %
Lymphs Abs: 1.1 10*3/uL (ref 0.7–4.0)
MCH: 30.8 pg (ref 26.0–34.0)
MCHC: 32.3 g/dL (ref 30.0–36.0)
MCV: 95.4 fL (ref 80.0–100.0)
MONO ABS: 0.5 10*3/uL (ref 0.1–1.0)
Monocytes Relative: 11 %
NEUTROS ABS: 3 10*3/uL (ref 1.7–7.7)
NEUTROS PCT: 60 %
NRBC: 0 % (ref 0.0–0.2)
PLATELETS: 174 10*3/uL (ref 150–400)
RBC: 3.25 MIL/uL — AB (ref 3.87–5.11)
RDW: 13.3 % (ref 11.5–15.5)
WBC: 4.9 10*3/uL (ref 4.0–10.5)

## 2018-10-24 LAB — BETA 2 MICROGLOBULIN, SERUM: Beta-2 Microglobulin: 3.1 mg/L — ABNORMAL HIGH (ref 0.6–2.4)

## 2018-10-24 LAB — APTT: aPTT: 36 seconds (ref 24–36)

## 2018-10-24 LAB — PROTIME-INR
INR: 0.87
PROTHROMBIN TIME: 11.8 s (ref 11.4–15.2)

## 2018-10-24 MED ORDER — MIDAZOLAM HCL 5 MG/5ML IJ SOLN
INTRAMUSCULAR | Status: AC
  Filled 2018-10-24: qty 5

## 2018-10-24 MED ORDER — IOHEXOL 300 MG/ML  SOLN
75.0000 mL | Freq: Once | INTRAMUSCULAR | Status: AC | PRN
  Administered 2018-10-24: 14:00:00 75 mL via INTRAVENOUS

## 2018-10-24 MED ORDER — IOPAMIDOL (ISOVUE-300) INJECTION 61%
15.0000 mL | INTRAVENOUS | Status: AC
  Administered 2018-10-24 (×2): 15 mL via ORAL

## 2018-10-24 MED ORDER — HEPARIN SOD (PORK) LOCK FLUSH 100 UNIT/ML IV SOLN
INTRAVENOUS | Status: AC
  Filled 2018-10-24: qty 5

## 2018-10-24 MED ORDER — FENTANYL CITRATE (PF) 100 MCG/2ML IJ SOLN
INTRAMUSCULAR | Status: AC
  Filled 2018-10-24: qty 4

## 2018-10-24 NOTE — Progress Notes (Signed)
[place] [place] NAME: Cynthia Lopez    MR#:  [id number]  DATE OF BIRTH:  Nov 16, 1929  SUBJECTIVE:  CHIEF COMPLAINT:   Chief Complaint  Patient presents with  . Back Pain   -Patient is pleasantly confused.  Daughter at bedside.  CT-guided biopsy has been canceled today  REVIEW OF SYSTEMS:  Review of Systems  Unable to perform ROS: Dementia    DRUG ALLERGIES:   Allergies  Allergen Reactions  . Penicillins   . Sulfa Antibiotics     VITALS:  Blood pressure (!) 120/56, pulse 74, temperature 98.4 F (36.9 C), temperature source Oral, resp. rate 15, height 5' (1.524 m), weight 56.7 kg, SpO2 92 %.  PHYSICAL EXAMINATION:  Physical Exam  GENERAL:  84 y.o.-year-old patient lying in the bed with no acute distress.  EYES: Pupils equal, round, reactive to light and accommodation. No scleral icterus. Extraocular muscles intact.  HEENT: Head atraumatic, normocephalic. Oropharynx and nasopharynx clear.  NECK:  Supple, no jugular venous distention.  Neck brace is present.  No thyroid enlargement, no tenderness.  LUNGS: Normal breath sounds bilaterally, no wheezing, rales,rhonchi or crepitation. No use of accessory muscles of respiration. Decreased bibasilar breath sounds CARDIOVASCULAR: S1, S2 normal. No  rubs, or gallops. 2/6 systolic murmur present ABDOMEN: Soft, nontender, nondistended. Bowel sounds present. No organomegaly or mass.  Tender paraspinal- mid back EXTREMITIES: No pedal edema, cyanosis, or clubbing.  NEUROLOGIC: Cranial nerves II through XII are intact. Muscle strength 5/5 in all extremities. Sensation intact. Gait not checked.  PSYCHIATRIC: The patient is alert and oriented to self  SKIN: No obvious rash, lesion, or ulcer.    LABORATORY PANEL:   CBC Recent Labs  Lab 10/24/18 0600  WBC 4.9  HGB 10.0*  HCT 31.0*  PLT 174    ------------------------------------------------------------------------------------------------------------------  Chemistries  Recent Labs  Lab 10/21/18 1605 10/22/18 0535  NA 146* 145  K 3.7 3.9  CL 108 108  CO2 30 30  GLUCOSE 107* 120*  BUN 13 15  CREATININE 1.11* 0.97  CALCIUM 9.7 9.2  AST 22  --   ALT 18  --   ALKPHOS 50  --   BILITOT 0.7  --    ------------------------------------------------------------------------------------------------------------------  Cardiac Enzymes No results for input(s): TROPONINI in the last 168 hours. ------------------------------------------------------------------------------------------------------------------  RADIOLOGY:  No results found.  EKG:   Orders placed or performed in visit on 12/01/06  . EKG 12-Lead    ASSESSMENT AND PLAN:   84 year old female with past medical history significant for dementia, hypertension and TIA presents to hospital secondary to worsening weakness and falls and low back pain.  1.  T11 lytic lesion with cord compression-likely malignancy related to metastatic disease. -Unknown primary at this time.  Renal function is within normal limits.  Patient does have anemia, serum kappa and lambda light chains are within normal limits. -Other tests for myeloma are pending.  However solid organ malignancies cannot be ruled out either. -Patient was supposed to get a bone marrow biopsy today, however radiology discontinued as patient's family did not show interest in pursuing further aggressive care at this time. -Per oncology recommendations, will order a CT of the chest and abdomen at this time. -Radiation oncology follow-up as outpatient to see if the tumor can be amenable to radiation once cancer diagnosis is confirmed. -Appreciate neurosurgery input.  Not a surgical candidate.  Concern for worsening and cord compression. -TLSO brace and encourage ambulation  2.  Dementia-seems  to be at baseline.  On  Aricept  3.  GERD-Protonix  4.  DVT prophylaxis-Lovenox  Physical therapy recommended home health     All the records are reviewed and case discussed with Care Management/Social Workerr. Management plans discussed with the patient, family and they are in agreement.  CODE STATUS: DNR  TOTAL TIME TAKING CARE OF THIS PATIENT: 39 minutes.   POSSIBLE D/C IN 2 DAYS, DEPENDING ON CLINICAL CONDITION.   Gladstone Lighter M.D on 10/24/2018 at 2:14 PM  Between 7am to 6pm - Pager - [phone]  After 6pm go to [email or web address] - password [email or web address] [facility]  Office  [phone]  CC: Primary care physician; Derinda Late, MD

## 2018-10-24 NOTE — Progress Notes (Addendum)
CT Biopsy procedure canceled for today per Dr. Pascal Lux. Report provided to [facility] on 1 C. Transport to take Pt back to room.

## 2018-10-24 NOTE — Progress Notes (Signed)
Patient ID: Cynthia Lopez, female   DOB: 07-25-1929, 84 y.o.   MRN: [id number]   Request made for CT-guided bone marrow and right-sided parapelvic soft tissue mass biopsy to establish diagnosis of presumed metastatic disease.  At the present time, the patient's daughter wishes to wait for finalization of the serologic work-up for the presumed diagnosis of multiple myeloma prior to proceeding with CT-guided biopsy.  If serum laboratory values are unrevealing and a tissue diagnosis is required prior to the patient undergoing undergoing palliative radiation therapy (per my discussion with Dr. Tasia Catchings, the patient is unlikely to undergo systemic therapy given her multiple medical comorbidities), CT-guided bone marrow biopsy and soft tissue mass biopsy could be performed as indicated.  This was discussed with ordering oncologist Dr. Tasia Catchings who is also in agreement with this proposed plan of care  Ronny Bacon, MD Pager #: [phone]

## 2018-10-24 NOTE — Progress Notes (Signed)
Hematology/Oncology Progress Note [facility] [phone] [phone]  Patient Care Team: Derinda Late, MD as PCP - General (Family Medicine)   Name of the patient: Cynthia Lopez  [id number]  07-18-1929  Date of visit: 10/24/18   INTERVAL HISTORY-  No acute overnight event.  Daughter at bedside. She went for bone marrow/paraspinal tumor biopsy this a.m. and the procedure was canceled as IR feels risk of biopsy is high and after IR discussed with patient's daughter, procedure was canceled.   Review of systems- Review of Systems  Unable to perform ROS: Medical condition  Denies any pain  Allergies  Allergen Reactions  . Penicillins   . Sulfa Antibiotics     Patient Active Problem List   Diagnosis Date Noted  . Malnutrition of moderate degree 10/24/2018  . Low back pain 10/21/2018     Past Medical History:  Diagnosis Date  . Alzheimer disease ([place])   . Hypercholesteremia   . Transient cerebral ischemia      Past Surgical History:  Procedure Laterality Date  . KNEE SURGERY      Social History   Socioeconomic History  . Marital status: Widowed    Spouse name: Not on file  . Number of children: Not on file  . Years of education: Not on file  . Highest education level: Not on file  Occupational History  . Not on file  Social Needs  . Financial resource strain: Not on file  . Food insecurity:    Worry: Not on file    Inability: Not on file  . Transportation needs:    Medical: Not on file    Non-medical: Not on file  Tobacco Use  . Smoking status: Never Smoker  . Smokeless tobacco: Never Used  Substance and Sexual Activity  . Alcohol use: No  . Drug use: No  . Sexual activity: Not on file  Lifestyle  . Physical activity:    Days per week: Not on file    Minutes per session: Not on file  . Stress: Not on file  Relationships  . Social connections:    Talks on phone: Not on file    Gets together: Not on  file    Attends religious service: Not on file    Active member of club or organization: Not on file    Attends meetings of clubs or organizations: Not on file    Relationship status: Not on file  . Intimate partner violence:    Fear of current or ex partner: Not on file    Emotionally abused: Not on file    Physically abused: Not on file    Forced sexual activity: Not on file  Other Topics Concern  . Not on file  Social History Narrative  . Not on file     Family History  Problem Relation Age of Onset  . Melanoma Brother      Current Facility-Administered Medications:  .  0.9 %  sodium chloride infusion, , Intravenous, Continuous, Hoss, Arthur, MD .  acetaminophen (TYLENOL) tablet 650 mg, 650 mg, Oral, Q6H PRN, 650 mg at 10/21/18 2329 **OR** acetaminophen (TYLENOL) suppository 650 mg, 650 mg, Rectal, Q6H PRN, Sudini, Srikar, MD .  albuterol (PROVENTIL) (2.5 MG/3ML) 0.083% nebulizer solution 2.5 mg, 2.5 mg, Nebulization, Q2H PRN, Sudini, Srikar, MD .  aspirin EC tablet 81 mg, 81 mg, Oral, Daily, Sudini, Srikar, MD, 81 mg at 10/24/18 1102 .  donepezil (ARICEPT) tablet 10 mg, 10 mg, Oral, Daily,  Hillary Bow, MD, 10 mg at 10/24/18 1102 .  enoxaparin (LOVENOX) injection 40 mg, 40 mg, Subcutaneous, Q24H, Sudini, Srikar, MD, 40 mg at 10/22/18 2132 .  feeding supplement (ENSURE ENLIVE) (ENSURE ENLIVE) liquid 237 mL, 237 mL, Oral, BID BM, Sudini, Srikar, MD, 237 mL at 10/24/18 1103 .  iopamidol (ISOVUE-300) 61 % injection 15 mL, 15 mL, Oral, Q1 Hr x 2, Kalisetti, Radhika, MD .  magnesium oxide (MAG-OX) tablet 200 mg, 200 mg, Oral, Daily, Sudini, Srikar, MD, 200 mg at 10/24/18 1102 .  multivitamin with minerals tablet 1 tablet, 1 tablet, Oral, Daily, Sudini, Srikar, MD, 1 tablet at 10/24/18 1102 .  ondansetron (ZOFRAN) tablet 4 mg, 4 mg, Oral, Q6H PRN **OR** ondansetron (ZOFRAN) injection 4 mg, 4 mg, Intravenous, Q6H PRN, Sudini, Srikar, MD .  pantoprazole (PROTONIX) EC tablet 40 mg, 40  mg, Oral, Daily, Sudini, Srikar, MD, 40 mg at 10/24/18 1102 .  polyethylene glycol (MIRALAX / GLYCOLAX) packet 17 g, 17 g, Oral, Daily PRN, Sudini, Srikar, MD .  traMADol Veatrice Bourbon) tablet 50 mg, 50 mg, Oral, Q6H PRN, Hillary Bow, MD, 50 mg at 10/23/18 2108   Physical exam:  Vitals:   10/23/18 1245 10/23/18 1922 10/24/18 0420 10/24/18 0904  BP: (!) 141/72 112/82 (!) 113/53 (!) 120/56  Pulse: 94 (!) 108 68 74  Resp: _0 Temp: 97.8 F (36.6 C) 98.3 F (36.8 C) 97.9 F (36.6 C) 98.4 F (36.9 C)  TempSrc: Oral Oral  Oral  SpO2: 96% 97% 93% 92%  Weight:    125 lb (56.7 kg)  Height:    5' (1.524 m)   Physical Exam  Constitutional: No distress.  Thin elderly female lying in bed without any distress.  HENT:  Head: Normocephalic and atraumatic.  Mouth/Throat: No oropharyngeal exudate.  Eyes: Pupils are equal, round, and reactive to light. EOM are normal. No scleral icterus.  Cardiovascular: Normal rate and regular rhythm.  No murmur heard. Pulmonary/Chest: Effort normal. No respiratory distress. She has no rales.  Abdominal: Soft. She exhibits no distension.  Musculoskeletal: Normal range of motion. She exhibits no edema.  Neurological: She is alert. She exhibits normal muscle tone.  Oriented x1 to name.  Skin: Skin is warm and dry.    CMP Latest Ref Rng & Units 10/22/2018  Glucose 70 - 99 mg/dL 120(H)  BUN 8 - 23 mg/dL 15  Creatinine 0.44 - 1.00 mg/dL 0.97  Sodium 135 - 145 mmol/L 145  Potassium 3.5 - 5.1 mmol/L 3.9  Chloride 98 - 111 mmol/L 108  CO2 22 - 32 mmol/L 30  Calcium 8.9 - 10.3 mg/dL 9.2  Total Protein 6.5 - 8.1 g/dL -  Total Bilirubin 0.3 - 1.2 mg/dL -  Alkaline Phos 38 - 126 U/L -  AST 15 - 41 U/L -  ALT 0 - 44 U/L -   CBC Latest Ref Rng & Units 10/24/2018  WBC 4.0 - 10.5 K/uL 4.9  Hemoglobin 12.0 - 15.0 g/dL 10.0(L)  Hematocrit 36.0 - 46.0 % 31.0(L)  Platelets 150 - 400 K/uL 174   RADIOGRAPHIC STUDIES: I have personally reviewed the  radiological images as listed and agreed with the findings in the report.  Dg Elbow Complete Left  Result Date: 10/21/2018 CLINICAL DATA:  84 year old female with a history of fall EXAM: LEFT ELBOW - COMPLETE 3+ VIEW COMPARISON:  None. FINDINGS: There appears to be nondisplaced fracture of the radial head. No additional fracture line. The lateral view demonstrates joint effusion with  up lifting of the anterior fat pad. Osteopenia. No radiopaque foreign body.  No subcutaneous gas. IMPRESSION: Suspected nondisplaced radial head fracture with associated joint effusion. Osteopenia. Electronically Signed   By: Corrie Mckusick D.O.   On: 10/21/2018 13:56   Ct Head Wo Contrast  Result Date: 10/21/2018 CLINICAL DATA:  84 year old female with a history of fall EXAM: CT HEAD WITHOUT CONTRAST CT CERVICAL SPINE WITHOUT CONTRAST TECHNIQUE: Multidetector CT imaging of the head and cervical spine was performed following the standard protocol without intravenous contrast. Multiplanar CT image reconstructions of the cervical spine were also generated. COMPARISON:  Head CT 07/09/2014, 03/03/2012, 05/09/2009 FINDINGS: CT HEAD FINDINGS Brain: No acute intracranial hemorrhage. No midline shift or mass effect. Gray-white differentiation is maintained. Unremarkable configuration of the ventricles. Mild volume loss. No significant periventricular white matter disease. Vascular: Calcifications of intracranial vasculature. Skull: No displaced fracture. There are small lucent lesions of the skull, which do appear to have progressed when compared to the head CT dated 05/09/2009. No scalp hematoma. Sinuses/Orbits: No acute finding. Other: None. CT CERVICAL SPINE FINDINGS Alignment: No malalignment with facets aligned and no subluxation of the vertebral bodies. Skull base and vertebrae: There is a lucent lesion in the left aspect of C1 with cortical disruption of the anterior C1 ring. There are multiple small lucent lesions throughout the  cervical spine. The most pronounced outside of C1 are best seen on the sagittal reformatted images at the base of the dens C2 and the spinous process of C7. Additional lucent lesions of the upper thoracic spine vertebral bodies and posterior elements. Soft tissues and spinal canal: No canal hematoma. No adenopathy. Disc levels: Disc space narrowing is mild through the cervical spine. Most advanced disc space narrowing at C5-C6 and C6-C7. Mild uncovertebral joint disease with no significant foraminal narrowing. Upper chest: Unremarkable appearance of the lung apices. Incomplete healing of posterior left third rib fracture. Other: None IMPRESSION: Head CT: No acute intracranial abnormality. Small lucent lesions of the skull which have progressed compared to the prior head CT. Appearance is concerning for multiple myeloma or other metastatic disease. Referral for oncologic evaluation recommended. Cervical CT: There is a lucent lesion of the left C1 vertebral body with potential pathologic fracture. Recommend correlation with pain at the C1 level, and possibly spine surgery consult. Multiple lucent lesions throughout the cervical spine as well as upper thoracic spine. Appearance is concerning for multiple myeloma or other metastatic disease. Referral for oncologic evaluation recommended. Electronically Signed   By: Corrie Mckusick D.O.   On: 10/21/2018 14:42   Ct Cervical Spine Wo Contrast  Result Date: 10/21/2018 CLINICAL DATA:  84 year old female with a history of fall EXAM: CT HEAD WITHOUT CONTRAST CT CERVICAL SPINE WITHOUT CONTRAST TECHNIQUE: Multidetector CT imaging of the head and cervical spine was performed following the standard protocol without intravenous contrast. Multiplanar CT image reconstructions of the cervical spine were also generated. COMPARISON:  Head CT 07/09/2014, 03/03/2012, 05/09/2009 FINDINGS: CT HEAD FINDINGS Brain: No acute intracranial hemorrhage. No midline shift or mass effect.  Gray-white differentiation is maintained. Unremarkable configuration of the ventricles. Mild volume loss. No significant periventricular white matter disease. Vascular: Calcifications of intracranial vasculature. Skull: No displaced fracture. There are small lucent lesions of the skull, which do appear to have progressed when compared to the head CT dated 05/09/2009. No scalp hematoma. Sinuses/Orbits: No acute finding. Other: None. CT CERVICAL SPINE FINDINGS Alignment: No malalignment with facets aligned and no subluxation of the vertebral bodies. Skull base and  vertebrae: There is a lucent lesion in the left aspect of C1 with cortical disruption of the anterior C1 ring. There are multiple small lucent lesions throughout the cervical spine. The most pronounced outside of C1 are best seen on the sagittal reformatted images at the base of the dens C2 and the spinous process of C7. Additional lucent lesions of the upper thoracic spine vertebral bodies and posterior elements. Soft tissues and spinal canal: No canal hematoma. No adenopathy. Disc levels: Disc space narrowing is mild through the cervical spine. Most advanced disc space narrowing at C5-C6 and C6-C7. Mild uncovertebral joint disease with no significant foraminal narrowing. Upper chest: Unremarkable appearance of the lung apices. Incomplete healing of posterior left third rib fracture. Other: None IMPRESSION: Head CT: No acute intracranial abnormality. Small lucent lesions of the skull which have progressed compared to the prior head CT. Appearance is concerning for multiple myeloma or other metastatic disease. Referral for oncologic evaluation recommended. Cervical CT: There is a lucent lesion of the left C1 vertebral body with potential pathologic fracture. Recommend correlation with pain at the C1 level, and possibly spine surgery consult. Multiple lucent lesions throughout the cervical spine as well as upper thoracic spine. Appearance is concerning for  multiple myeloma or other metastatic disease. Referral for oncologic evaluation recommended. Electronically Signed   By: Corrie Mckusick D.O.   On: 10/21/2018 14:42   Ct Lumbar Spine Wo Contrast  Result Date: 10/21/2018 CLINICAL DATA:  Unwitnessed fall. Unable to stand. Back pain, worse than usual. EXAM: CT LUMBAR SPINE WITHOUT CONTRAST TECHNIQUE: Multidetector CT imaging of the lumbar spine was performed without intravenous contrast administration. Multiplanar CT image reconstructions were also generated. COMPARISON:  CT of the abdomen and pelvis 09/15/2018 FINDINGS: Segmentation: 5 fully formed vertebral bodies are present. The lowest fully formed vertebral body is L5 Alignment: Slight retrolisthesis is present at L1-2 and L2-3, stable. Rightward curvature is centered at L1-2. Vertebrae: Moderate osteopenia is present. There is a diffuse heterogeneous pattern of the marrow. A destructive lesion is present in the posterior elements on the left at T11. This is only partially imaged. Tumor extends into the spinal canal. Tumor likely extends into the left T10-11 foramen. Endplate sclerotic changes are present on the left at L2-3 greater than L3-4. No other definite destructive lytic lesions are present in the lumbar spine. There is a lesion extending into the right iliac bone. Soft tissue tumor extends laterally into the gluteus musculature. Other lesions are suspected within the heterogeneity. Paraspinal and other soft tissues: Atherosclerotic calcifications are present in the aorta without aneurysm. A stable cyst is present in the left lobe of the liver. Solid organs are otherwise unremarkable. No significant adenopathy is present. Disc levels: T12-L1: No significant stenosis. L1-2: Asymmetric left-sided facet hypertrophy is present. Mild left foraminal narrowing is present uncovering of the disc contributes. L2-3: There is uncovering of a broad-based disc protrusion. Moderate facet hypertrophy contributes. Mild  foraminal narrowing is worse on the left. L3-4: A broad-based disc protrusion is present. Moderate facet hypertrophy and ligamentum flavum thickening is noted. Central canal is patent. Mild foraminal narrowing is evident bilaterally. L4-5: A broad-based disc protrusion is present. Moderate facet hypertrophy and ligamentum flavum thickening contribute to mild central canal stenosis with subarticular narrowing bilaterally. Mild foraminal narrowing is also present bilaterally. L5-S1: Mild disc bulging is present. There is mild facet hypertrophy bilaterally. No significant stenosis is present. IMPRESSION: 1. Lytic soft tissue tumor involving the posterior elements on the left at T11 invasion  into the spinal canal. Margin of tumor cannot be clearly identified on this noncontrast CT. Recommend MRI cervical, thoracic, and lumbar spine without and with contrast further evaluation of metastatic disease. No definite primary is evident. 2. Soft tissue tumor with lysis of the right iliac bone extends into the right gluteal musculature. 3. Diffuse heterogeneous marrow signal likely represents osteopenia. Other small metastatic lesions are not excluded. 4. Mild foraminal narrowing at L1-2, L2-3, and L3-4. 5. Mild central canal and foraminal narrowing bilaterally at L4-5. These results were called by telephone at the time of interpretation on 10/21/2018 at 2:19 pm to [facility] , who verbally acknowledged these results. Electronically Signed   By: San Morelle M.D.   On: 10/21/2018 14:20   Ct Pelvis Wo Contrast  Result Date: 10/21/2018 CLINICAL DATA:  Un witnessed fall with pelvic pain, initial encounter EXAM: CT PELVIS WITHOUT CONTRAST TECHNIQUE: Multidetector CT imaging of the pelvis was performed following the standard protocol without intravenous contrast. COMPARISON:  09/15/2018 FINDINGS: Urinary Tract:  Bladder is decompressed. Bowel: No obstructive or inflammatory changes of the bowel are noted. The appendix is  not well visualized although no inflammatory changes are seen. Vascular/Lymphatic: Atherosclerotic calcifications are noted without aneurysmal dilatation. No lymphadenopathy is seen. Reproductive: Bulky uterus with calcifications consistent with fibroid change. This is stable from the prior exam. Other:  None Musculoskeletal: Generalized osteopenia of the bony structures is noted. His somewhat mottled appearance to the bony structures is noted raising suspicion for underlying bony process. In right iliac bone there is a lytic lesion identified with cortical breakdown and soft tissue mass show she aided. The soft tissue component measures approximately 5.5 by 3.5 cm. It communicates with the lytic lesion seen in the right iliac bone. No other similar bony lesion is seen. No acute fracture is noted IMPRESSION: No evidence of acute fracture. Lytic lesion within the right iliac bone with associated soft tissue component extending into the right buttock as described. These changes may represent metastatic disease although the possibility of multiple myeloma deserves consideration as well. A somewhat mottled appearance to the bones is noted suggestive of myeloma. Clinical correlation is recommended. Further workup can be performed as clinically indicated. Electronically Signed   By: Inez Catalina M.D.   On: 10/21/2018 14:31   Mr Total Spine Mets Screening  Addendum Date: 10/21/2018   ADDENDUM REPORT: 10/21/2018 18:08 ADDENDUM: Findings were discussed with Ashok Cordia, PA Electronically Signed   By: Franchot Gallo M.D.   On: 10/21/2018 18:08   Result Date: 10/21/2018 CLINICAL DATA:  Cancer associated back pain.  Abnormal CT. EXAM: MRI TOTAL SPINE WITHOUT AND WITH CONTRAST TECHNIQUE: Multisequence MR imaging of the spine from the cervical spine to the sacrum was performed prior to and following IV contrast administration for evaluation of spinal metastatic disease. CONTRAST:  5 mL Gadovist IV COMPARISON:  CT cervical  and lumbar spine 10/21/2018 FINDINGS: MRI CERVICAL SPINE FINDINGS Image quality degraded by extensive artifact. Unenhanced images are essentially nondiagnostic. Multiple bony lesions are best seen on the prior CT. Postcontrast images are slightly better quality and reveal enhancing mass lesion in the C2 vertebral body as well as the left C1 lateral mass. Small enhancing mass in the T1 vertebral body and T2 vertebral body. No epidural tumor or cord compression. MRI THORACIC SPINE FINDINGS Image quality degraded by extensive motion. No thoracic fracture is identified. Moderately large right-sided disc protrusion at T3-4 Enhancing tumor involving the spinous process of T11 extending into the posterior epidural  space. Axial images demonstrate cord flattening and mild cord compression. Patchy enhancement T7, T8, T9, and T10, and T11 vertebral bodies. Small enhancing lesion T6 vertebral body. These are subtle lesions but given the other findings are most likely due to tumor, probably myeloma. Small left pleural effusion MRI LUMBAR SPINE FINDINGS Image quality degraded by moderate motion. Negative for lumbar fracture. Patchy enhancement L1 and L2 vertebral bodies likely due to tumor. No epidural tumor in the lumbar canal. Degenerative retrolisthesis L1-2 and L2-3 with disc degeneration and spurring. IMPRESSION: 1. MRI of the cervical, thoracic, and lumbar spine is significantly degraded by motion. There are multiple areas of enhancing bone marrow most likely due to multiple myeloma. This is better seen on the CT scan of the cervical and lumbar spine. 2. Tumor involving the spinous process of T11 extending in the post epidural space. There is cord flattening with mild cord compression. Electronically Signed: By: Franchot Gallo M.D. On: 10/21/2018 18:04   Dg Femur Min 2 Views Right  Result Date: 10/21/2018 CLINICAL DATA:  Right-sided hip pain after fall yesterday. EXAM: RIGHT FEMUR 2 VIEWS COMPARISON:  CT abdomen pelvis  dated December 15, 2018. FINDINGS: There is no evidence of fracture or other focal bone lesions. Mild right knee and hip degenerative changes. Osteopenia. Vascular calcifications. Soft tissues are unremarkable. IMPRESSION: No acute osseous abnormality. Electronically Signed   By: Titus Dubin M.D.   On: 10/21/2018 13:59    Assessment and plan-  Patient is a 84 y.o. female presents for evaluation of the fall.  #Multiple lytic lesions, and paraspinal soft tissue mass Multiple myeloma was in the differential diagnosis. Multiple myeloma panel still pending.  However kappa/lambda light chain ratio normal, which is atypical Other solid tumor metastasis certainly within the differential diagnosis, ie breast cancer. I recommend proceed with CT chest abdomen pelvis to look for other potential lesions.  Given patient's advanced age, Alzheimer's disease and other comorbidities, I agree that patient is not a good candidate for aggressive chemotherapy.  I received a phone call from interventional radiology and spoke with radiologist.  Concern from interventional radiologist is patient's current condition and risk of procedure is high.  Procedure was canceled after IR discussed with daughter.  I had a lengthy discussion with daughter.  Regarding to the necessity of tissue diagnosis, from oncology aspect, patient's T11  soft tissue mass is invading spinal canal, posing potential threat for cord compression and she will benefit from palliative radiation.  From palliative aspect, preventing a potential cord compression may help her life quality.  Tissue diagnosis is highly recommended prior to any anti-neoplasm treatment including radiation. Not candidate for aggressive treatments.  She may benefit from palliative steroid treatments and low dose Revlimid if she has multiple myeloma. Other less intensive treatments may have a role pending her biopsy results, ie, anti hormone treatment for HR positive breast  cancer, etc.  Tissue diagnosis will help to guide further treatments if family desires.   Discussed with Dr.Kalisetti.  Thank you for allowing me to participate in the care of this patient.  Total face to face encounter time for this patient visit was 35 min. >50% of the time was  spent in counseling and coordination of care.  Earlie Server, MD, PhD Hematology Oncology [facility] Pager- [phone] 10/24/2018

## 2018-10-25 DIAGNOSIS — G309 Alzheimer's disease, unspecified: Secondary | ICD-10-CM | POA: Diagnosis not present

## 2018-10-25 DIAGNOSIS — C9000 Multiple myeloma not having achieved remission: Secondary | ICD-10-CM | POA: Diagnosis not present

## 2018-10-25 DIAGNOSIS — M545 Low back pain: Secondary | ICD-10-CM | POA: Diagnosis not present

## 2018-10-25 DIAGNOSIS — E870 Hyperosmolality and hypernatremia: Secondary | ICD-10-CM | POA: Diagnosis not present

## 2018-10-25 LAB — BASIC METABOLIC PANEL
Anion gap: 7 (ref 5–15)
BUN: 12 mg/dL (ref 8–23)
CO2: 30 mmol/L (ref 22–32)
CREATININE: 1.05 mg/dL — AB (ref 0.44–1.00)
Calcium: 9.1 mg/dL (ref 8.9–10.3)
Chloride: 106 mmol/L (ref 98–111)
GFR, EST AFRICAN AMERICAN: 54 mL/min — AB (ref >60)
GFR, EST NON AFRICAN AMERICAN: 46 mL/min — AB (ref >60)
Glucose, Bld: 101 mg/dL — ABNORMAL HIGH (ref 70–99)
Potassium: 3.4 mmol/L — ABNORMAL LOW (ref 3.5–5.1)
SODIUM: 143 mmol/L (ref 135–145)

## 2018-10-25 LAB — MULTIPLE MYELOMA PANEL, SERUM
ALBUMIN SERPL ELPH-MCNC: 2.7 g/dL — AB (ref 2.9–4.4)
ALPHA 1: 0.3 g/dL (ref 0.0–0.4)
Albumin/Glob SerPl: 1.2 (ref 0.7–1.7)
Alpha2 Glob SerPl Elph-Mcnc: 0.9 g/dL (ref 0.4–1.0)
B-Globulin SerPl Elph-Mcnc: 0.7 g/dL (ref 0.7–1.3)
GAMMA GLOB SERPL ELPH-MCNC: 0.3 g/dL — AB (ref 0.4–1.8)
GLOBULIN, TOTAL: 2.3 g/dL (ref 2.2–3.9)
IGA: 16 mg/dL — AB (ref 64–422)
IGM (IMMUNOGLOBULIN M), SRM: 135 mg/dL (ref 26–217)
IgG (Immunoglobin G), Serum: 326 mg/dL — ABNORMAL LOW (ref 700–1600)
M Protein SerPl Elph-Mcnc: 0.1 g/dL — ABNORMAL HIGH
TOTAL PROTEIN ELP: 5 g/dL — AB (ref 6.0–8.5)

## 2018-10-25 MED ORDER — ENOXAPARIN SODIUM 30 MG/0.3ML ~~LOC~~ SOLN: 30.0000 mg | SUBCUTANEOUS | Status: DC

## 2018-10-25 MED ORDER — TRAMADOL HCL 50 MG PO TABS: 50.0000 mg | ORAL_TABLET | Freq: Four times a day (QID) | ORAL | 0 refills | Status: AC | PRN

## 2018-10-25 MED ORDER — ENSURE ENLIVE PO LIQD: 237.0000 mL | Freq: Two times a day (BID) | ORAL | 12 refills | Status: AC

## 2018-10-25 NOTE — Progress Notes (Signed)
Patient discharged home with home health per MD order. All discharge instructions given to daughter and all questions answered. Prescription given to patients daughter.

## 2018-10-25 NOTE — Discharge Instructions (Signed)
Please wear the TLSO brace while up and moving

## 2018-10-25 NOTE — Discharge Summary (Signed)
[facility] NAME: Cynthia Lopez    MR#:  [id number]  DATE OF BIRTH:  07-09-1929  DATE OF ADMISSION:  10/21/2018   ADMITTING PHYSICIAN: Hillary Bow, MD  DATE OF DISCHARGE:  10/25/18  PRIMARY CARE PHYSICIAN: Derinda Late, MD   ADMISSION DIAGNOSIS:   Cancer associated pain [G89.3] Metastatic multiple myeloma to bone ([place]) [C90.00] Fall, initial encounter [W19.XXXA]  DISCHARGE DIAGNOSIS:   Active Problems:   Low back pain   Malnutrition of moderate degree   Cancer associated pain   Fall   Lytic lesion of bone on x-ray   Mass of soft tissue   SECONDARY DIAGNOSIS:   Past Medical History:  Diagnosis Date  . Alzheimer disease ([place])   . Hypercholesteremia   . Transient cerebral ischemia     HOSPITAL COURSE:   84 year old female with past medical history significant for dementia, hypertension and TIA presents to hospital secondary to worsening weakness and falls and low back pain.  1.  T11 lytic lesion with cord compression-likely malignancy related to metastatic disease. -Unknown primary at this time.  Renal function is within normal limits.  Patient does have anemia, serum kappa and lambda light chains are within normal limits. -serological test  for myeloma are pending-protein electrophoresis is pending. \ -Daughter not interested in pursuing a bone marrow biopsy at this time.  CT of the chest and abdomen have been done to rule out other malignancies.  No other masses noted but patient has diffuse lytic lesions in the bones. -Since patient is ambulatory, daughter wants to take the patient home.  We will follow-up with oncology next week for results to see if patient can be placed on any empiric steroids at that time. -Radiation oncologist was consulted by oncology who recommended that they will need a tissue biopsy prior to any radiation to the lower back.  At this time patient does not have any cord compression  symptoms.- -Appreciate neurosurgery input.  Not a surgical candidate.  -TLSO brace and encourage ambulation  2.  Dementia-seems to be at baseline.  On Aricept  3.  GERD-PPI   Physical therapy recommended home health To be discharged home today We will also add palliative care at discharge.  Daughter open to hospice services if patient's condition declines.  DISCHARGE CONDITIONS:   Guarded  CONSULTS OBTAINED:   Treatment Team:  Earlie Server, MD  DRUG ALLERGIES:   Allergies  Allergen Reactions  . Penicillins   . Sulfa Antibiotics    DISCHARGE MEDICATIONS:   Allergies as of 10/25/2018      Reactions   Penicillins    Sulfa Antibiotics       Medication List    TAKE these medications   aspirin EC 81 MG tablet Take by mouth.   b complex vitamins tablet Take by mouth.   CALTRATE 600+D3 600-800 MG-UNIT Tabs Generic drug:  Calcium Carb-Cholecalciferol Take 1 tablet by mouth daily.   donepezil 10 MG tablet Commonly known as:  ARICEPT Take 10 mg by mouth daily.   feeding supplement (ENSURE ENLIVE) Liqd Take 237 mLs by mouth 2 (two) times daily between meals.   Magnesium 250 MG Tabs Take 250 mg by mouth daily.   MULTI-VITAMINS Tabs Take 1 tablet by mouth daily.   omeprazole 20 MG capsule Commonly known as:  PRILOSEC Take 20 mg by mouth 2 (two) times daily.   RA CRANBERRY 500 MG Caps Generic drug:  Cranberry Take 1,000 mg by mouth  daily.   ranitidine 300 MG tablet Commonly known as:  ZANTAC Take 300 mg by mouth at bedtime.   traMADol 50 MG tablet Commonly known as:  ULTRAM Take 1 tablet (50 mg total) by mouth every 6 (six) hours as needed for moderate pain or severe pain.   triamcinolone cream 0.5 % Commonly known as:  KENALOG Apply 1 application topically 2 (two) times daily.   vitamin C 500 MG tablet Commonly known as:  ASCORBIC ACID Take 500 mg by mouth 2 (two) times daily.        DISCHARGE INSTRUCTIONS:   1.  PCP follow-up in 1 to 2  weeks 2.  Follow-up with oncology within 1 week 3.  Palliative care follow-up at home  DIET:   Regular diet  ACTIVITY:   Activity as tolerated  OXYGEN:   Home Oxygen: No.  Oxygen Delivery: room air  DISCHARGE LOCATION:   home   If you experience worsening of your admission symptoms, develop shortness of breath, life threatening emergency, suicidal or homicidal thoughts you must seek medical attention immediately by calling 911 or calling your MD immediately  if symptoms less severe.  You Must read complete instructions/literature along with all the possible adverse reactions/side effects for all the Medicines you take and that have been prescribed to you. Take any new Medicines after you have completely understood and accpet all the possible adverse reactions/side effects.   Please note  You were cared for by a hospitalist during your hospital stay. If you have any questions about your discharge medications or the care you received while you were in the hospital after you are discharged, you can call the unit and asked to speak with the hospitalist on call if the hospitalist that took care of you is not available. Once you are discharged, your primary care physician will handle any further medical issues. Please note that NO REFILLS for any discharge medications will be authorized once you are discharged, as it is imperative that you return to your primary care physician (or establish a relationship with a primary care physician if you do not have one) for your aftercare needs so that they can reassess your need for medications and monitor your lab values.    On the day of Discharge:  VITAL SIGNS:   Blood pressure (!) 105/58, pulse 81, temperature 98.2 F (36.8 C), temperature source Oral, resp. rate 18, height 5' (1.524 m), weight 56.7 kg, SpO2 96 %.  PHYSICAL EXAMINATION:   GENERAL:  84 y.o.-year-old patient lying in the bed with no acute distress.  EYES: Pupils equal,  round, reactive to light and accommodation. No scleral icterus. Extraocular muscles intact.  HEENT: Head atraumatic, normocephalic. Oropharynx and nasopharynx clear.  NECK:  Supple, no jugular venous distention.  Neck brace is present.  No thyroid enlargement, no tenderness.  LUNGS: Normal breath sounds bilaterally, no wheezing, rales,rhonchi or crepitation. No use of accessory muscles of respiration. Decreased bibasilar breath sounds CARDIOVASCULAR: S1, S2 normal. No  rubs, or gallops. 2/6 systolic murmur present ABDOMEN: Soft, nontender, nondistended. Bowel sounds present. No organomegaly or mass.  Tender paraspinal- mid back EXTREMITIES: No pedal edema, cyanosis, or clubbing.  NEUROLOGIC: Cranial nerves II through XII are intact. Muscle strength 5/5 in all extremities. Sensation intact. Gait not checked.  PSYCHIATRIC: The patient is alert and oriented to self  SKIN: No obvious rash, lesion, or ulcer  DATA REVIEW:   CBC Recent Labs  Lab 10/24/18 0600  WBC 4.9  HGB 10.0*  HCT 31.0*  PLT 174    Chemistries  Recent Labs  Lab 10/21/18 1605  10/25/18 0350  NA 146*   < > 143  K 3.7   < > 3.4*  CL 108   < > 106  CO2 30   < > 30  GLUCOSE 107*   < > 101*  BUN 13   < > 12  CREATININE 1.11*   < > 1.05*  CALCIUM 9.7   < > 9.1  AST 22  --   --   ALT 18  --   --   ALKPHOS 50  --   --   BILITOT 0.7  --   --    < > = values in this interval not displayed.     Microbiology Results  No results found for this or any previous visit.  RADIOLOGY:  Ct Chest W Contrast  Result Date: 10/24/2018 CLINICAL DATA:  84 year old female with history of multiple osseous lesions noted on recent CT examinations. Evaluate for primary malignancy. EXAM: CT CHEST, ABDOMEN, AND PELVIS WITH CONTRAST TECHNIQUE: Multidetector CT imaging of the chest, abdomen and pelvis was performed following the standard protocol during bolus administration of intravenous contrast. CONTRAST:  21m OMNIPAQUE IOHEXOL 300  MG/ML  SOLN COMPARISON:  Chest CT 08/16/2005. CT the abdomen and pelvis 09/15/2018. FINDINGS: CT CHEST FINDINGS Cardiovascular: Heart size is normal. There is no significant pericardial fluid, thickening or pericardial calcification. Aortic atherosclerosis. No coronary artery calcifications. Mediastinum/Nodes: No pathologically enlarged mediastinal or hilar lymph nodes. Esophagus is unremarkable in appearance. No axillary lymphadenopathy. Lungs/Pleura: 4 mm left upper lobe nodule (axial image 79 of series 4), stable dating back to 2006, considered definitively benign. No other definite suspicious appearing pulmonary nodules or masses. Trace right pleural effusion lying dependently. Moderate left pleural effusion lying dependently. These are both associated with some dependent subsegmental atelectasis in the lower lobes of the lungs bilaterally (left greater than right). Musculoskeletal: Numerous predominantly lytic lesions scattered throughout the visualized axial and appendicular skeleton. The largest of these is associated with the lateral aspect of the right fourth rib where there is a destructive lesion with a large enhancing soft tissue component which measures approximately 4.3 x 3.2 x 3.8 cm (axial image 27 of series 2 and coronal image 45 of series 5). Many of the lytic rib lesions are associated with nondisplaced pathologic fractures. CT ABDOMEN PELVIS FINDINGS Hepatobiliary: 2.9 x 3.5 cm well-defined low-attenuation lesion in the central aspect of segment 4A of the liver, compatible with a simple cyst. Subcentimeter low-attenuation lesion in segment 6 of the liver, too small to characterize, but stable compared to prior study, potentially a tiny cyst. No other definite suspicious appearing hepatic lesions. No intra or extrahepatic biliary ductal dilatation. Gallbladder is normal in appearance. Pancreas: No pancreatic mass. No pancreatic ductal dilatation. No pancreatic or peripancreatic fluid or  inflammatory changes. Spleen: Partially calcified lesion in the anterior aspect of the spleen, incompletely characterized, but stable compared to prior examination 08/16/2005, presumably related to remote trauma. Adrenals/Urinary Tract: Bilateral kidneys and bilateral adrenal glands are normal in appearance. No hydroureteronephrosis. Urinary bladder is normal in appearance. Stomach/Bowel: Normal appearance of the stomach. No pathologic dilatation of small bowel or colon. The appendix is not confidently identified and may be surgically absent. Regardless, there are no inflammatory changes noted adjacent to the cecum to suggest the presence of an acute appendicitis at this time. Vascular/Lymphatic: Aortic atherosclerosis, without evidence of aneurysm in the abdominal or pelvic vasculature. Complete  obstruction of the left common iliac artery with distal reconstitution of flow in the left internal and external iliac arteries, presumably from collateralization. No lymphadenopathy noted in the abdomen or pelvis. Reproductive: Small calcifications in the uterus, most compatible with small fibroids. Ovaries are atrophic. Other: No significant volume of ascites.  No pneumoperitoneum. Musculoskeletal: Innumerable predominantly lytic lesions are again noted throughout the visualized axial and appendicular skeleton. The largest of these is centered in the right gluteal musculature with involvement of the right ilium (axial image 87 of series 2) measuring 5.5 x 3.1 cm. IMPRESSION: 1. Innumerable osseous lesions scattered throughout the visualized axial and appendicular skeleton, with numerous nondisplaced pathologic fractures of the ribs bilaterally, as above. No definite primary malignancy confidently identified in the chest, abdomen or pelvis. 2. Trace right and moderate left pleural effusions lying dependently with some associated passive atelectasis in the dependent portions of the lower lobes of the lungs bilaterally. 3.  Aortic atherosclerosis. 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 4. Additional incidental findings, as above. Electronically Signed   By: Vinnie Langton M.D.   On: 10/24/2018 15:31   Ct Abdomen Pelvis W Contrast  Result Date: 10/24/2018 CLINICAL DATA:  84 year old female with history of multiple osseous lesions noted on recent CT examinations. Evaluate for primary malignancy. EXAM: CT CHEST, ABDOMEN, AND PELVIS WITH CONTRAST TECHNIQUE: Multidetector CT imaging of the chest, abdomen and pelvis was performed following the standard protocol during bolus administration of intravenous contrast. CONTRAST:  31m OMNIPAQUE IOHEXOL 300 MG/ML  SOLN COMPARISON:  Chest CT 08/16/2005. CT the abdomen and pelvis 09/15/2018. FINDINGS: CT CHEST FINDINGS Cardiovascular: Heart size is normal. There is no significant pericardial fluid, thickening or pericardial calcification. Aortic atherosclerosis. No coronary artery calcifications. Mediastinum/Nodes: No pathologically enlarged mediastinal or hilar lymph nodes. Esophagus is unremarkable in appearance. No axillary lymphadenopathy. Lungs/Pleura: 4 mm left upper lobe nodule (axial image 79 of series 4), stable dating back to 2006, considered definitively benign. No other definite suspicious appearing pulmonary nodules or masses. Trace right pleural effusion lying dependently. Moderate left pleural effusion lying dependently. These are both associated with some dependent subsegmental atelectasis in the lower lobes of the lungs bilaterally (left greater than right). Musculoskeletal: Numerous predominantly lytic lesions scattered throughout the visualized axial and appendicular skeleton. The largest of these is associated with the lateral aspect of the right fourth rib where there is a destructive lesion with a large enhancing soft tissue component which measures approximately 4.3 x 3.2 x 3.8 cm (axial image 27 of  series 2 and coronal image 45 of series 5). Many of the lytic rib lesions are associated with nondisplaced pathologic fractures. CT ABDOMEN PELVIS FINDINGS Hepatobiliary: 2.9 x 3.5 cm well-defined low-attenuation lesion in the central aspect of segment 4A of the liver, compatible with a simple cyst. Subcentimeter low-attenuation lesion in segment 6 of the liver, too small to characterize, but stable compared to prior study, potentially a tiny cyst. No other definite suspicious appearing hepatic lesions. No intra or extrahepatic biliary ductal dilatation. Gallbladder is normal in appearance. Pancreas: No pancreatic mass. No pancreatic ductal dilatation. No pancreatic or peripancreatic fluid or inflammatory changes. Spleen: Partially calcified lesion in the anterior aspect of the spleen, incompletely characterized, but stable compared to prior examination 08/16/2005, presumably related to remote trauma. Adrenals/Urinary Tract: Bilateral kidneys and bilateral adrenal glands are normal in appearance. No hydroureteronephrosis. Urinary bladder is normal in appearance. Stomach/Bowel: Normal appearance of the stomach. No pathologic  dilatation of small bowel or colon. The appendix is not confidently identified and may be surgically absent. Regardless, there are no inflammatory changes noted adjacent to the cecum to suggest the presence of an acute appendicitis at this time. Vascular/Lymphatic: Aortic atherosclerosis, without evidence of aneurysm in the abdominal or pelvic vasculature. 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 No lymphadenopathy noted in the abdomen or pelvis. Reproductive: Small calcifications in the uterus, most compatible with small fibroids. Ovaries are atrophic. Other: No significant volume of ascites.  No pneumoperitoneum. Musculoskeletal: Innumerable predominantly lytic  lesions are again noted throughout the visualized axial and appendicular skeleton. The largest of these is centered in the right gluteal musculature with involvement of the right ilium (axial image 87 of series 2) measuring 5.5 x 3.1 cm. IMPRESSION: 1. Innumerable osseous lesions scattered throughout the visualized axial and appendicular skeleton, with numerous nondisplaced pathologic fractures of the ribs bilaterally, as above. No definite primary malignancy confidently identified in the chest, abdomen or pelvis. 2. Trace right and moderate left pleural effusions lying dependently with some associated passive atelectasis in the dependent portions of the lower lobes of the lungs bilaterally. 3. Aortic atherosclerosis. 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 4. Additional incidental findings, as above. Electronically Signed   By: Vinnie Langton M.D.   On: 10/24/2018 15:31     Management plans discussed with the patient, family and they are in agreement.  CODE STATUS:     Code Status Orders  (From admission, onward)         Start     Ordered   10/21/18 1949  Do not attempt resuscitation (DNR)  Continuous    Question Answer Comment  In the event of cardiac or respiratory ARREST Do not call a "code blue"   In the event of cardiac or respiratory ARREST Do not perform Intubation, CPR, defibrillation or ACLS   In the event of cardiac or respiratory ARREST Use medication by any route, position, wound care, and other measures to relive pain and suffering. May use oxygen, suction and manual treatment of airway obstruction as needed for comfort.      10/21/18 1950        Code Status History    This patient has a current code status but no historical code status.    Advance Directive Documentation     Most Recent Value  Type of Advance Directive  Healthcare Power of Attorney  Pre-existing out of facility DNR order (yellow  form or pink MOST form)  -  "MOST" Form in Place?  -      TOTAL TIME TAKING CARE OF THIS PATIENT: 38 minutes.    Gladstone Lighter M.D on 10/25/2018 at 2:01 PM  Between 7am to 6pm - Pager - [phone]  After 6pm go to [email or web address] - Proofreader  [facility]  Office  [phone]  CC: Primary care physician; Derinda Late, MD   Note: This dictation was prepared with Dragon dictation along with smaller phrase technology. Any transcriptional errors that result from this process are unintentional.

## 2018-10-26 DIAGNOSIS — M899 Disorder of bone, unspecified: Secondary | ICD-10-CM | POA: Diagnosis not present

## 2018-10-26 DIAGNOSIS — C9000 Multiple myeloma not having achieved remission: Secondary | ICD-10-CM | POA: Diagnosis not present

## 2018-10-29 ENCOUNTER — Other Ambulatory Visit: Payer: Self-pay | Admitting: Pharmacist

## 2018-10-29 NOTE — Patient Outreach (Signed)
[facility]) Care Management  [place]   10/29/2018  Shanetta [facility] 1929-06-06 [id number]  Ms. Pelto is an 84 year old female outreached by [place] services for a 30 day post discharge medication review.  PMHx includes, but not limited to, metastatic multiple myeloma to bone, alzheimer disease, hypercholesteremia, and history of TIA.   Successful outreach attempt - spoke with patient's daughter who manages patient's medications. HIPAA identifiers verified by daughter.   Subjective: Daughter states patient is doing okay after recent hospital discharge. She states that patient is still in pain, especially when she moves around. She reports she does give patient her prescribed Tramadol as needed and tries not to move her as much to decrease her pain occurrence. Daughter also reports that the patient is not eating very much and that it is difficult to get her to drink her Ensure shakes daily. Patient's multivitamins have recently been changed to a chewable form to help with administration ease, however daughter states patient will often refuse to take them. Daughter reports no medication affordability or access issues.   Objective:  Scr: 1.05 mg/dL on 11/7 CrCl: 42 ml/min  Current Medications: Current Outpatient Medications  Medication Sig Dispense Refill  . aspirin EC 81 MG tablet Take by mouth daily.     Marland Kitchen b complex vitamins tablet Take by mouth daily.     . Calcium Carb-Cholecalciferol (CALTRATE 600+D3) 600-800 MG-UNIT TABS Take 1 tablet by mouth daily.    . Cranberry (RA CRANBERRY) 500 MG CAPS Take 1,000 mg by mouth daily.     Marland Kitchen donepezil (ARICEPT) 10 MG tablet Take 10 mg by mouth daily.     . feeding supplement, ENSURE ENLIVE, (ENSURE ENLIVE) LIQD Take 237 mLs by mouth 2 (two) times daily between meals. 237 mL 12  . Magnesium 250 MG TABS Take 250 mg by mouth daily.    . Multiple Vitamin (MULTI-VITAMINS) TABS Take 1 tablet by mouth daily.     Marland Kitchen omeprazole  (PRILOSEC) 20 MG capsule Take 20 mg by mouth 2 (two) times daily.     . ranitidine (ZANTAC) 300 MG tablet Take 300 mg by mouth at bedtime.    . traMADol (ULTRAM) 50 MG tablet Take 1 tablet (50 mg total) by mouth every 6 (six) hours as needed for moderate pain or severe pain. 25 tablet 0  . triamcinolone cream (KENALOG) 0.5 % Apply 1 application topically 2 (two) times daily.     . vitamin C (ASCORBIC ACID) 500 MG tablet Take 500 mg by mouth 2 (two) times daily.      No current facility-administered medications for this visit.     Functional Status: In your present state of health, do you have any difficulty performing the following activities: 10/21/2018  Hearing? N  Vision? N  Difficulty concentrating or making decisions? Y  Walking or climbing stairs? Y  Dressing or bathing? Y  Doing errands, shopping? Y  Some recent data might be hidden    Fall/Depression Screening: No flowsheet data found. No flowsheet data found.  ASSESSMENT: Date Discharged from Hospital: 10/25/2018 Date Medication Reconciliation Performed: 10/29/2018  No new medications were prescribed at discharge.  Patient was recently discharged from hospital and all medications have been reviewed  Drugs sorted by system:  Neurologic/Psychologic: donepezil   Cardiovascular: Aspirin  Gastrointestinal: omeprazole, ranitidine  Topical: Kenalog  Pain: Tramadol   Vitamins/Minerals: B complex vitamins, calcium carbonate-cholecalciferol, magnesium, multivitamin, vitamin C   Medications to avoid in the elderly: Ranitidine  increases the risk of delirium in the elderly with highest risk in patients with CrCl <50. Patient's current CrCl ~42.  Drug interactions: Ranitidine with tramadol can increase the exposure of tramadol which increases the risk of respiratory depression. Continue to monitor patient.   PLAN: Note routed to PCP, Dr. Ozzie Hoyle, Sherian Rein D PGY1 Pharmacy Resident  Phone [phone] 10/29/2018   10:52 AM

## 2018-10-31 ENCOUNTER — Other Ambulatory Visit: Payer: Self-pay

## 2018-10-31 NOTE — Patient Outreach (Signed)
[facility]) Care Management  10/31/2018  Cynthia Lopez Jan 08, 1929 [id number]   EMMI- General Discharge  RED ON EMMI ALERT Day # 4 Date: 10/30/18 Red Alert Reason:  Lost interest in things? Yes  Sad/hopeless/anxious/empty? Yes   Outreach attempt:spoke with daughter Cynthia Lopez.  She is able to verify HIPAA.  Discussed red alert.  She reports that the system did not quite hear her correctly.  She states that patient is fine.  She states that patient has some confusion and she is her primary caregiver.  She states she took patient to the physician on Friday for follow up.  Daughter states that patient is to have [facility] for home health but no one has contacted them. Advised her that I would call [facility] and get back with her.  She verbalized understanding.    Telephone call to [facility].  Spoke with [employer] [facility] [employer].  She states that they have not been able to get in contact with patient and have sent notification to PCP.  We discussed patient contact information.  CM gave daughter Cynthia Lopez's number to call.  She states they will outreach today for evaluation.    Telephone call to daughter Cynthia Lopez to notify her that [facility] would be in contact to set up evaluation.  She verbalized understanding and appreciative of the information.  She declines any further needs presently.     Plan: RN CM will close case.  Jone Baseman, RN, MSN [facility] Care Management Care Management Coordinator Direct Line [phone] Toll Free: [phone]  Fax: [phone]

## 2018-11-02 ENCOUNTER — Other Ambulatory Visit: Payer: Self-pay

## 2018-11-02 ENCOUNTER — Inpatient Hospital Stay: Payer: PPO | Attending: Oncology | Admitting: Oncology

## 2018-11-02 ENCOUNTER — Encounter: Payer: Self-pay | Admitting: Oncology

## 2018-11-02 ENCOUNTER — Encounter (INDEPENDENT_AMBULATORY_CARE_PROVIDER_SITE_OTHER): Payer: Self-pay

## 2018-11-02 VITALS — BP 121/67 | HR 77 | Temp 98.0°F | Resp 18 | Ht 60.0 in | Wt 125.8 lb

## 2018-11-02 DIAGNOSIS — J90 Pleural effusion, not elsewhere classified: Secondary | ICD-10-CM | POA: Diagnosis not present

## 2018-11-02 DIAGNOSIS — I10 Essential (primary) hypertension: Secondary | ICD-10-CM | POA: Diagnosis not present

## 2018-11-02 DIAGNOSIS — M898X5 Other specified disorders of bone, thigh: Secondary | ICD-10-CM | POA: Diagnosis not present

## 2018-11-02 DIAGNOSIS — M7989 Other specified soft tissue disorders: Secondary | ICD-10-CM | POA: Insufficient documentation

## 2018-11-02 DIAGNOSIS — D472 Monoclonal gammopathy: Secondary | ICD-10-CM | POA: Diagnosis not present

## 2018-11-02 DIAGNOSIS — R911 Solitary pulmonary nodule: Secondary | ICD-10-CM | POA: Insufficient documentation

## 2018-11-02 DIAGNOSIS — M8580 Other specified disorders of bone density and structure, unspecified site: Secondary | ICD-10-CM | POA: Insufficient documentation

## 2018-11-02 DIAGNOSIS — I700 Atherosclerosis of aorta: Secondary | ICD-10-CM | POA: Insufficient documentation

## 2018-11-02 DIAGNOSIS — G309 Alzheimer's disease, unspecified: Secondary | ICD-10-CM | POA: Insufficient documentation

## 2018-11-02 DIAGNOSIS — Z7982 Long term (current) use of aspirin: Secondary | ICD-10-CM | POA: Insufficient documentation

## 2018-11-02 DIAGNOSIS — Z8673 Personal history of transient ischemic attack (TIA), and cerebral infarction without residual deficits: Secondary | ICD-10-CM | POA: Diagnosis not present

## 2018-11-02 DIAGNOSIS — E7800 Pure hypercholesterolemia, unspecified: Secondary | ICD-10-CM | POA: Insufficient documentation

## 2018-11-02 DIAGNOSIS — Z79899 Other long term (current) drug therapy: Secondary | ICD-10-CM | POA: Insufficient documentation

## 2018-11-02 DIAGNOSIS — F0281 Dementia in other diseases classified elsewhere with behavioral disturbance: Secondary | ICD-10-CM | POA: Diagnosis not present

## 2018-11-02 DIAGNOSIS — R222 Localized swelling, mass and lump, trunk: Secondary | ICD-10-CM | POA: Diagnosis not present

## 2018-11-02 DIAGNOSIS — K7689 Other specified diseases of liver: Secondary | ICD-10-CM | POA: Diagnosis not present

## 2018-11-02 NOTE — Progress Notes (Signed)
Hematology/Oncology Follow Up Note [facility]  Telephone:([phone] [phone]  Patient Care Team: Derinda Late, MD as PCP - General (Family Medicine)   Name of the patient: Cynthia Lopez  [id number]  1929/01/26   REASON FOR VISIT  follow-up after recent hospitalization, paraspinal tumor multiple lytic lesion  INTERVAL HISTORY Patient with a past medical history of dementia due to Alzheimer's disease, hypertension, was recently admitted at [facility] due to worsening weakness and falls in the low back pain. 10/21/2018 CT lumbar spine without showed lytic soft tissue tumor involving the posterior elements on the left at T11 invasion into the spinal canal.  Margin of tumor cannot be clearly identified on this noncontrast CT. Soft tissue tumor with lysis of the right iliac bone extends into the right gluteal musculature.   CT pelvis without contrast showed no evidence of acute fracture.  Lytic lesion within the right iliac bone with associated soft tissue component extending to the right buttock as described CT head negative for acute process. CT chest abdomen pelvis did not reveal any primary malignancy Patient was seen by neurosurgeon no intervention indicated. Oncology was also consulted and patient was evaluated by me. Multiple myeloma panel was obtained as well as light chain ratio.  Light chain ratio came back normal limits. Patient was recommended to have a bone marrow biopsy/paraspinal soft tissue lesion biopsy. Due to the fact that tumor invasion into the spinal canal, patient most likely will benefit from radiation oncology for palliative radiation to prevent cord compression. After discussion between interventional radiologist and patient's daughter who is the power of attorney, daughter declined biopsy and want to wait and do multiple myeloma panel results. Patient was discharged to home with TLSO brace  Today patient presents for follow-up visit.   Accompanied by daughter.   Per daughter, patient has had skin abrasion due to brace.  Currently she is not using brace. According to daughter, patient's condition has declined, not eating much.  Review of Systems  Unable to perform ROS: Mental acuity  Constitutional: Positive for malaise/fatigue.  Musculoskeletal: Positive for back pain.        Allergies  Allergen Reactions  . Penicillins   . Sulfa Antibiotics      Past Medical History:  Diagnosis Date  . Alzheimer disease ([place])   . Hypercholesteremia   . Transient cerebral ischemia      Past Surgical History:  Procedure Laterality Date  . APPENDECTOMY    . KNEE SURGERY      Social History   Socioeconomic History  . Marital status: Widowed    Spouse name: Not on file  . Number of children: Not on file  . Years of education: Not on file  . Highest education level: Not on file  Occupational History  . Not on file  Social Needs  . Financial resource strain: Not on file  . Food insecurity:    Worry: Not on file    Inability: Not on file  . Transportation needs:    Medical: Not on file    Non-medical: Not on file  Tobacco Use  . Smoking status: Never Smoker  . Smokeless tobacco: Never Used  Substance and Sexual Activity  . Alcohol use: No  . Drug use: No  . Sexual activity: Not on file  Lifestyle  . Physical activity:    Days per week: Not on file    Minutes per session: Not on file  . Stress: Not on file  Relationships  .  Social connections:    Talks on phone: Not on file    Gets together: Not on file    Attends religious service: Not on file    Active member of club or organization: Not on file    Attends meetings of clubs or organizations: Not on file    Relationship status: Not on file  . Intimate partner violence:    Fear of current or ex partner: Not on file    Emotionally abused: Not on file    Physically abused: Not on file    Forced sexual activity: Not on file  Other Topics Concern  .  Not on file  Social History Narrative  . Not on file    Family History  Problem Relation Age of Onset  . Alzheimer's disease Mother   . Heart attack Father   . Melanoma Brother   . Lung cancer Brother      Current Outpatient Medications:  .  aspirin EC 81 MG tablet, Take by mouth daily. , Disp: , Rfl:  .  b complex vitamins tablet, Take by mouth daily. , Disp: , Rfl:  .  Calcium Carb-Cholecalciferol (CALTRATE 600+D3) 600-800 MG-UNIT TABS, Take 1 tablet by mouth daily., Disp: , Rfl:  .  Cranberry (RA CRANBERRY) 500 MG CAPS, Take 1,000 mg by mouth daily. , Disp: , Rfl:  .  donepezil (ARICEPT) 10 MG tablet, Take 10 mg by mouth daily. , Disp: , Rfl:  .  feeding supplement, ENSURE ENLIVE, (ENSURE ENLIVE) LIQD, Take 237 mLs by mouth 2 (two) times daily between meals., Disp: 237 mL, Rfl: 12 .  Magnesium 250 MG TABS, Take 250 mg by mouth daily., Disp: , Rfl:  .  Multiple Vitamin (MULTI-VITAMINS) TABS, Take 1 tablet by mouth daily. , Disp: , Rfl:  .  omeprazole (PRILOSEC) 20 MG capsule, Take 20 mg by mouth 2 (two) times daily. , Disp: , Rfl:  .  ranitidine (ZANTAC) 300 MG tablet, Take 300 mg by mouth at bedtime., Disp: , Rfl:  .  traMADol (ULTRAM) 50 MG tablet, Take 1 tablet (50 mg total) by mouth every 6 (six) hours as needed for moderate pain or severe pain., Disp: 25 tablet, Rfl: 0 .  triamcinolone cream (KENALOG) 0.5 %, Apply 1 application topically 2 (two) times daily. , Disp: , Rfl:  .  vitamin C (ASCORBIC ACID) 500 MG tablet, Take 500 mg by mouth 2 (two) times daily. , Disp: , Rfl:   Physical exam: ECOG 3 Vitals:   11/02/18 0937  BP: 121/67  Pulse: 77  Resp: 18  Temp: 98 F (36.7 C)  TempSrc: Tympanic  Weight: 125 lb 12.8 oz (57.1 kg)  Height: 5' (1.524 m)   Physical Exam  Constitutional: No distress.  HENT:  Head: Normocephalic and atraumatic.  Eyes: Pupils are equal, round, and reactive to light. EOM are normal. No scleral icterus.  Neck: Neck supple.  Cardiovascular:  Normal rate.  Pulmonary/Chest: Effort normal. No respiratory distress. She has no wheezes.  Decreased breath sounds bilaterally.   Abdominal: Soft. Bowel sounds are normal. She exhibits no distension. There is no tenderness.  Musculoskeletal: Normal range of motion. She exhibits no edema or deformity.  Neurological: She is alert.  Skin: Skin is warm and dry.  Psychiatric: She has a normal mood and affect.    CMP Latest Ref Rng & Units 10/25/2018  Glucose 70 - 99 mg/dL 101(H)  BUN 8 - 23 mg/dL 12  Creatinine 0.44 - 1.00 mg/dL 1.05(H)  Sodium 135 - 145 mmol/L 143  Potassium 3.5 - 5.1 mmol/L 3.4(L)  Chloride 98 - 111 mmol/L 106  CO2 22 - 32 mmol/L 30  Calcium 8.9 - 10.3 mg/dL 9.1  Total Protein 6.5 - 8.1 g/dL -  Total Bilirubin 0.3 - 1.2 mg/dL -  Alkaline Phos 38 - 126 U/L -  AST 15 - 41 U/L -  ALT 0 - 44 U/L -   CBC Latest Ref Rng & Units 10/24/2018  WBC 4.0 - 10.5 K/uL 4.9  Hemoglobin 12.0 - 15.0 g/dL 10.0(L)  Hematocrit 36.0 - 46.0 % 31.0(L)  Platelets 150 - 400 K/uL 174   RADIOGRAPHIC STUDIES: I have personally reviewed the radiological images as listed and agreed with the findings in the report.  Dg Elbow Complete Left  Result Date: 10/21/2018 CLINICAL DATA:  84 year old female with a history of fall EXAM: LEFT ELBOW - COMPLETE 3+ VIEW COMPARISON:  None. FINDINGS: There appears to be nondisplaced fracture of the radial head. No additional fracture line. The lateral view demonstrates joint effusion with up lifting of the anterior fat pad. Osteopenia. No radiopaque foreign body.  No subcutaneous gas. IMPRESSION: Suspected nondisplaced radial head fracture with associated joint effusion. Osteopenia. Electronically Signed   By: Corrie Mckusick D.O.   On: 10/21/2018 13:56   Ct Head Wo Contrast  Result Date: 10/21/2018 CLINICAL DATA:  84 year old female with a history of fall EXAM: CT HEAD WITHOUT CONTRAST CT CERVICAL SPINE WITHOUT CONTRAST TECHNIQUE: Multidetector CT imaging of the  head and cervical spine was performed following the standard protocol without intravenous contrast. Multiplanar CT image reconstructions of the cervical spine were also generated. COMPARISON:  Head CT 07/09/2014, 03/03/2012, 05/09/2009 FINDINGS: CT HEAD FINDINGS Brain: No acute intracranial hemorrhage. No midline shift or mass effect. Gray-white differentiation is maintained. Unremarkable configuration of the ventricles. Mild volume loss. No significant periventricular white matter disease. Vascular: Calcifications of intracranial vasculature. Skull: No displaced fracture. There are small lucent lesions of the skull, which do appear to have progressed when compared to the head CT dated 05/09/2009. No scalp hematoma. Sinuses/Orbits: No acute finding. Other: None. CT CERVICAL SPINE FINDINGS Alignment: No malalignment with facets aligned and no subluxation of the vertebral bodies. Skull base and vertebrae: There is a lucent lesion in the left aspect of C1 with cortical disruption of the anterior C1 ring. There are multiple small lucent lesions throughout the cervical spine. The most pronounced outside of C1 are best seen on the sagittal reformatted images at the base of the dens C2 and the spinous process of C7. Additional lucent lesions of the upper thoracic spine vertebral bodies and posterior elements. Soft tissues and spinal canal: No canal hematoma. No adenopathy. Disc levels: Disc space narrowing is mild through the cervical spine. Most advanced disc space narrowing at C5-C6 and C6-C7. Mild uncovertebral joint disease with no significant foraminal narrowing. Upper chest: Unremarkable appearance of the lung apices. Incomplete healing of posterior left third rib fracture. Other: None IMPRESSION: Head CT: No acute intracranial abnormality. Small lucent lesions of the skull which have progressed compared to the prior head CT. Appearance is concerning for multiple myeloma or other metastatic disease. Referral for  oncologic evaluation recommended. Cervical CT: There is a lucent lesion of the left C1 vertebral body with potential pathologic fracture. Recommend correlation with pain at the C1 level, and possibly spine surgery consult. Multiple lucent lesions throughout the cervical spine as well as upper thoracic spine. Appearance is concerning for multiple myeloma or other metastatic  disease. Referral for oncologic evaluation recommended. Electronically Signed   By: Corrie Mckusick D.O.   On: 10/21/2018 14:42   Ct Chest W Contrast  Result Date: 10/24/2018 CLINICAL DATA:  84 year old female with history of multiple osseous lesions noted on recent CT examinations. Evaluate for primary malignancy. EXAM: CT CHEST, ABDOMEN, AND PELVIS WITH CONTRAST TECHNIQUE: Multidetector CT imaging of the chest, abdomen and pelvis was performed following the standard protocol during bolus administration of intravenous contrast. CONTRAST:  62m OMNIPAQUE IOHEXOL 300 MG/ML  SOLN COMPARISON:  Chest CT 08/16/2005. CT the abdomen and pelvis 09/15/2018. FINDINGS: CT CHEST FINDINGS Cardiovascular: Heart size is normal. There is no significant pericardial fluid, thickening or pericardial calcification. Aortic atherosclerosis. No coronary artery calcifications. Mediastinum/Nodes: No pathologically enlarged mediastinal or hilar lymph nodes. Esophagus is unremarkable in appearance. No axillary lymphadenopathy. Lungs/Pleura: 4 mm left upper lobe nodule (axial image 79 of series 4), stable dating back to 2006, considered definitively benign. No other definite suspicious appearing pulmonary nodules or masses. Trace right pleural effusion lying dependently. Moderate left pleural effusion lying dependently. These are both associated with some dependent subsegmental atelectasis in the lower lobes of the lungs bilaterally (left greater than right). Musculoskeletal: Numerous predominantly lytic lesions scattered throughout the visualized axial and appendicular  skeleton. The largest of these is associated with the lateral aspect of the right fourth rib where there is a destructive lesion with a large enhancing soft tissue component which measures approximately 4.3 x 3.2 x 3.8 cm (axial image 27 of series 2 and coronal image 45 of series 5). Many of the lytic rib lesions are associated with nondisplaced pathologic fractures. CT ABDOMEN PELVIS FINDINGS Hepatobiliary: 2.9 x 3.5 cm well-defined low-attenuation lesion in the central aspect of segment 4A of the liver, compatible with a simple cyst. Subcentimeter low-attenuation lesion in segment 6 of the liver, too small to characterize, but stable compared to prior study, potentially a tiny cyst. No other definite suspicious appearing hepatic lesions. No intra or extrahepatic biliary ductal dilatation. Gallbladder is normal in appearance. Pancreas: No pancreatic mass. No pancreatic ductal dilatation. No pancreatic or peripancreatic fluid or inflammatory changes. Spleen: Partially calcified lesion in the anterior aspect of the spleen, incompletely characterized, but stable compared to prior examination 08/16/2005, presumably related to remote trauma. Adrenals/Urinary Tract: Bilateral kidneys and bilateral adrenal glands are normal in appearance. No hydroureteronephrosis. Urinary bladder is normal in appearance. Stomach/Bowel: Normal appearance of the stomach. No pathologic dilatation of small bowel or colon. The appendix is not confidently identified and may be surgically absent. Regardless, there are no inflammatory changes noted adjacent to the cecum to suggest the presence of an acute appendicitis at this time. Vascular/Lymphatic: Aortic atherosclerosis, without evidence of aneurysm in the abdominal or pelvic vasculature. 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 No lymphadenopathy noted in the  abdomen or pelvis. Reproductive: Small calcifications in the uterus, most compatible with small fibroids. Ovaries are atrophic. Other: No significant volume of ascites.  No pneumoperitoneum. Musculoskeletal: Innumerable predominantly lytic lesions are again noted throughout the visualized axial and appendicular skeleton. The largest of these is centered in the right gluteal musculature with involvement of the right ilium (axial image 87 of series 2) measuring 5.5 x 3.1 cm. IMPRESSION: 1. Innumerable osseous lesions scattered throughout the visualized axial and appendicular skeleton, with numerous nondisplaced pathologic fractures of the ribs bilaterally, as above. No definite primary malignancy confidently identified  in the chest, abdomen or pelvis. 2. Trace right and moderate left pleural effusions lying dependently with some associated passive atelectasis in the dependent portions of the lower lobes of the lungs bilaterally. 3. Aortic atherosclerosis. 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 4. Additional incidental findings, as above. Electronically Signed   By: Vinnie Langton M.D.   On: 10/24/2018 15:31   Ct Cervical Spine Wo Contrast  Result Date: 10/21/2018 CLINICAL DATA:  84 year old female with a history of fall EXAM: CT HEAD WITHOUT CONTRAST CT CERVICAL SPINE WITHOUT CONTRAST TECHNIQUE: Multidetector CT imaging of the head and cervical spine was performed following the standard protocol without intravenous contrast. Multiplanar CT image reconstructions of the cervical spine were also generated. COMPARISON:  Head CT 07/09/2014, 03/03/2012, 05/09/2009 FINDINGS: CT HEAD FINDINGS Brain: No acute intracranial hemorrhage. No midline shift or mass effect. Gray-white differentiation is maintained. Unremarkable configuration of the ventricles. Mild volume loss. No significant periventricular white matter disease. Vascular: Calcifications  of intracranial vasculature. Skull: No displaced fracture. There are small lucent lesions of the skull, which do appear to have progressed when compared to the head CT dated 05/09/2009. No scalp hematoma. Sinuses/Orbits: No acute finding. Other: None. CT CERVICAL SPINE FINDINGS Alignment: No malalignment with facets aligned and no subluxation of the vertebral bodies. Skull base and vertebrae: There is a lucent lesion in the left aspect of C1 with cortical disruption of the anterior C1 ring. There are multiple small lucent lesions throughout the cervical spine. The most pronounced outside of C1 are best seen on the sagittal reformatted images at the base of the dens C2 and the spinous process of C7. Additional lucent lesions of the upper thoracic spine vertebral bodies and posterior elements. Soft tissues and spinal canal: No canal hematoma. No adenopathy. Disc levels: Disc space narrowing is mild through the cervical spine. Most advanced disc space narrowing at C5-C6 and C6-C7. Mild uncovertebral joint disease with no significant foraminal narrowing. Upper chest: Unremarkable appearance of the lung apices. Incomplete healing of posterior left third rib fracture. Other: None IMPRESSION: Head CT: No acute intracranial abnormality. Small lucent lesions of the skull which have progressed compared to the prior head CT. Appearance is concerning for multiple myeloma or other metastatic disease. Referral for oncologic evaluation recommended. Cervical CT: There is a lucent lesion of the left C1 vertebral body with potential pathologic fracture. Recommend correlation with pain at the C1 level, and possibly spine surgery consult. Multiple lucent lesions throughout the cervical spine as well as upper thoracic spine. Appearance is concerning for multiple myeloma or other metastatic disease. Referral for oncologic evaluation recommended. Electronically Signed   By: Corrie Mckusick D.O.   On: 10/21/2018 14:42   Ct Lumbar Spine Wo  Contrast  Result Date: 10/21/2018 CLINICAL DATA:  Unwitnessed fall. Unable to stand. Back pain, worse than usual. EXAM: CT LUMBAR SPINE WITHOUT CONTRAST TECHNIQUE: Multidetector CT imaging of the lumbar spine was performed without intravenous contrast administration. Multiplanar CT image reconstructions were also generated. COMPARISON:  CT of the abdomen and pelvis 09/15/2018 FINDINGS: Segmentation: 5 fully formed vertebral bodies are present. The lowest fully formed vertebral body is L5 Alignment: Slight retrolisthesis is present at L1-2 and L2-3, stable. Rightward curvature is centered at L1-2. Vertebrae: Moderate osteopenia is present. There is a diffuse heterogeneous pattern of the marrow. A destructive lesion is present in the posterior elements on the left at T11. This is only partially imaged. Tumor extends into  the spinal canal. Tumor likely extends into the left T10-11 foramen. Endplate sclerotic changes are present on the left at L2-3 greater than L3-4. No other definite destructive lytic lesions are present in the lumbar spine. There is a lesion extending into the right iliac bone. Soft tissue tumor extends laterally into the gluteus musculature. Other lesions are suspected within the heterogeneity. Paraspinal and other soft tissues: Atherosclerotic calcifications are present in the aorta without aneurysm. A stable cyst is present in the left lobe of the liver. Solid organs are otherwise unremarkable. No significant adenopathy is present. Disc levels: T12-L1: No significant stenosis. L1-2: Asymmetric left-sided facet hypertrophy is present. Mild left foraminal narrowing is present uncovering of the disc contributes. L2-3: There is uncovering of a broad-based disc protrusion. Moderate facet hypertrophy contributes. Mild foraminal narrowing is worse on the left. L3-4: A broad-based disc protrusion is present. Moderate facet hypertrophy and ligamentum flavum thickening is noted. Central canal is patent.  Mild foraminal narrowing is evident bilaterally. L4-5: A broad-based disc protrusion is present. Moderate facet hypertrophy and ligamentum flavum thickening contribute to mild central canal stenosis with subarticular narrowing bilaterally. Mild foraminal narrowing is also present bilaterally. L5-S1: Mild disc bulging is present. There is mild facet hypertrophy bilaterally. No significant stenosis is present. IMPRESSION: 1. Lytic soft tissue tumor involving the posterior elements on the left at T11 invasion into the spinal canal. Margin of tumor cannot be clearly identified on this noncontrast CT. Recommend MRI cervical, thoracic, and lumbar spine without and with contrast further evaluation of metastatic disease. No definite primary is evident. 2. Soft tissue tumor with lysis of the right iliac bone extends into the right gluteal musculature. 3. Diffuse heterogeneous marrow signal likely represents osteopenia. Other small metastatic lesions are not excluded. 4. Mild foraminal narrowing at L1-2, L2-3, and L3-4. 5. Mild central canal and foraminal narrowing bilaterally at L4-5. These results were called by telephone at the time of interpretation on 10/21/2018 at 2:19 pm to [facility] , who verbally acknowledged these results. Electronically Signed   By: San Morelle M.D.   On: 10/21/2018 14:20   Ct Pelvis Wo Contrast  Result Date: 10/21/2018 CLINICAL DATA:  Un witnessed fall with pelvic pain, initial encounter EXAM: CT PELVIS WITHOUT CONTRAST TECHNIQUE: Multidetector CT imaging of the pelvis was performed following the standard protocol without intravenous contrast. COMPARISON:  09/15/2018 FINDINGS: Urinary Tract:  Bladder is decompressed. Bowel: No obstructive or inflammatory changes of the bowel are noted. The appendix is not well visualized although no inflammatory changes are seen. Vascular/Lymphatic: Atherosclerotic calcifications are noted without aneurysmal dilatation. No lymphadenopathy is seen.  Reproductive: Bulky uterus with calcifications consistent with fibroid change. This is stable from the prior exam. Other:  None Musculoskeletal: Generalized osteopenia of the bony structures is noted. His somewhat mottled appearance to the bony structures is noted raising suspicion for underlying bony process. In right iliac bone there is a lytic lesion identified with cortical breakdown and soft tissue mass show she aided. The soft tissue component measures approximately 5.5 by 3.5 cm. It communicates with the lytic lesion seen in the right iliac bone. No other similar bony lesion is seen. No acute fracture is noted IMPRESSION: No evidence of acute fracture. Lytic lesion within the right iliac bone with associated soft tissue component extending into the right buttock as described. These changes may represent metastatic disease although the possibility of multiple myeloma deserves consideration as well. A somewhat mottled appearance to the bones is noted suggestive of myeloma. Clinical  correlation is recommended. Further workup can be performed as clinically indicated. Electronically Signed   By: Inez Catalina M.D.   On: 10/21/2018 14:31   Ct Abdomen Pelvis W Contrast  Result Date: 10/24/2018 CLINICAL DATA:  84 year old female with history of multiple osseous lesions noted on recent CT examinations. Evaluate for primary malignancy. EXAM: CT CHEST, ABDOMEN, AND PELVIS WITH CONTRAST TECHNIQUE: Multidetector CT imaging of the chest, abdomen and pelvis was performed following the standard protocol during bolus administration of intravenous contrast. CONTRAST:  68m OMNIPAQUE IOHEXOL 300 MG/ML  SOLN COMPARISON:  Chest CT 08/16/2005. CT the abdomen and pelvis 09/15/2018. FINDINGS: CT CHEST FINDINGS Cardiovascular: Heart size is normal. There is no significant pericardial fluid, thickening or pericardial calcification. Aortic atherosclerosis. No coronary artery calcifications. Mediastinum/Nodes: No pathologically  enlarged mediastinal or hilar lymph nodes. Esophagus is unremarkable in appearance. No axillary lymphadenopathy. Lungs/Pleura: 4 mm left upper lobe nodule (axial image 79 of series 4), stable dating back to 2006, considered definitively benign. No other definite suspicious appearing pulmonary nodules or masses. Trace right pleural effusion lying dependently. Moderate left pleural effusion lying dependently. These are both associated with some dependent subsegmental atelectasis in the lower lobes of the lungs bilaterally (left greater than right). Musculoskeletal: Numerous predominantly lytic lesions scattered throughout the visualized axial and appendicular skeleton. The largest of these is associated with the lateral aspect of the right fourth rib where there is a destructive lesion with a large enhancing soft tissue component which measures approximately 4.3 x 3.2 x 3.8 cm (axial image 27 of series 2 and coronal image 45 of series 5). Many of the lytic rib lesions are associated with nondisplaced pathologic fractures. CT ABDOMEN PELVIS FINDINGS Hepatobiliary: 2.9 x 3.5 cm well-defined low-attenuation lesion in the central aspect of segment 4A of the liver, compatible with a simple cyst. Subcentimeter low-attenuation lesion in segment 6 of the liver, too small to characterize, but stable compared to prior study, potentially a tiny cyst. No other definite suspicious appearing hepatic lesions. No intra or extrahepatic biliary ductal dilatation. Gallbladder is normal in appearance. Pancreas: No pancreatic mass. No pancreatic ductal dilatation. No pancreatic or peripancreatic fluid or inflammatory changes. Spleen: Partially calcified lesion in the anterior aspect of the spleen, incompletely characterized, but stable compared to prior examination 08/16/2005, presumably related to remote trauma. Adrenals/Urinary Tract: Bilateral kidneys and bilateral adrenal glands are normal in appearance. No hydroureteronephrosis.  Urinary bladder is normal in appearance. Stomach/Bowel: Normal appearance of the stomach. No pathologic dilatation of small bowel or colon. The appendix is not confidently identified and may be surgically absent. Regardless, there are no inflammatory changes noted adjacent to the cecum to suggest the presence of an acute appendicitis at this time. Vascular/Lymphatic: Aortic atherosclerosis, without evidence of aneurysm in the abdominal or pelvic vasculature. 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 No lymphadenopathy noted in the abdomen or pelvis. Reproductive: Small calcifications in the uterus, most compatible with small fibroids. Ovaries are atrophic. Other: No significant volume of ascites.  No pneumoperitoneum. Musculoskeletal: Innumerable predominantly lytic lesions are again noted throughout the visualized axial and appendicular skeleton. The largest of these is centered in the right gluteal musculature with involvement of the right ilium (axial image 87 of series 2) measuring 5.5 x 3.1 cm. IMPRESSION: 1. Innumerable osseous lesions scattered throughout the visualized axial and appendicular skeleton, with numerous nondisplaced pathologic fractures of the ribs bilaterally, as above.  No definite primary malignancy confidently identified in the chest, abdomen or pelvis. 2. Trace right and moderate left pleural effusions lying dependently with some associated passive atelectasis in the dependent portions of the lower lobes of the lungs bilaterally. 3. Aortic atherosclerosis. 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 4. Additional incidental findings, as above. Electronically Signed   By: Vinnie Langton M.D.   On: 10/24/2018 15:31   Mr Total Spine Mets Screening  Addendum Date: 10/21/2018   ADDENDUM REPORT:  10/21/2018 18:08 ADDENDUM: Findings were discussed with Ashok Cordia, PA Electronically Signed   By: Franchot Gallo M.D.   On: 10/21/2018 18:08   Result Date: 10/21/2018 CLINICAL DATA:  Cancer associated back pain.  Abnormal CT. EXAM: MRI TOTAL SPINE WITHOUT AND WITH CONTRAST TECHNIQUE: Multisequence MR imaging of the spine from the cervical spine to the sacrum was performed prior to and following IV contrast administration for evaluation of spinal metastatic disease. CONTRAST:  5 mL Gadovist IV COMPARISON:  CT cervical and lumbar spine 10/21/2018 FINDINGS: MRI CERVICAL SPINE FINDINGS Image quality degraded by extensive artifact. Unenhanced images are essentially nondiagnostic. Multiple bony lesions are best seen on the prior CT. Postcontrast images are slightly better quality and reveal enhancing mass lesion in the C2 vertebral body as well as the left C1 lateral mass. Small enhancing mass in the T1 vertebral body and T2 vertebral body. No epidural tumor or cord compression. MRI THORACIC SPINE FINDINGS Image quality degraded by extensive motion. No thoracic fracture is identified. Moderately large right-sided disc protrusion at T3-4 Enhancing tumor involving the spinous process of T11 extending into the posterior epidural space. Axial images demonstrate cord flattening and mild cord compression. Patchy enhancement T7, T8, T9, and T10, and T11 vertebral bodies. Small enhancing lesion T6 vertebral body. These are subtle lesions but given the other findings are most likely due to tumor, probably myeloma. Small left pleural effusion MRI LUMBAR SPINE FINDINGS Image quality degraded by moderate motion. Negative for lumbar fracture. Patchy enhancement L1 and L2 vertebral bodies likely due to tumor. No epidural tumor in the lumbar canal. Degenerative retrolisthesis L1-2 and L2-3 with disc degeneration and spurring. IMPRESSION: 1. MRI of the cervical, thoracic, and lumbar spine is significantly degraded by motion. There  are multiple areas of enhancing bone marrow most likely due to multiple myeloma. This is better seen on the CT scan of the cervical and lumbar spine. 2. Tumor involving the spinous process of T11 extending in the post epidural space. There is cord flattening with mild cord compression. Electronically Signed: By: Franchot Gallo M.D. On: 10/21/2018 18:04   Dg Femur Min 2 Views Right  Result Date: 10/21/2018 CLINICAL DATA:  Right-sided hip pain after fall yesterday. EXAM: RIGHT FEMUR 2 VIEWS COMPARISON:  CT abdomen pelvis dated December 15, 2018. FINDINGS: There is no evidence of fracture or other focal bone lesions. Mild right knee and hip degenerative changes. Osteopenia. Vascular calcifications. Soft tissues are unremarkable. IMPRESSION: No acute osseous abnormality. Electronically Signed   By: Titus Dubin M.D.   On: 10/21/2018 13:59     Assessment and plan 1. Mass of soft tissue   2. Lytic bone lesion of hip   3. Monoclonal gammopathy of unknown significance (MGUS)   4. Alzheimer's dementia with behavioral disturbance, unspecified timing of dementia onset ([place])    Labs reviewed with patient's daughter.  I independently reviewed patient's CT images and discussed with patient's daughter.  Free light chain  ratio normal SPEP showed 0.1g/dl M protein, IgM monoclonal protein with lamda light chain specificity.  MGUS diagnosis was discussed with patient. MGUS does not explain the lytic lesion and paraspinal soft tissue mass.  ? Oligo-secretory multiple myeloma vs metastatic disease from solid tumor. I had a lengthy discussion with daughter and recommend patient to obtain tissue biopsy to finalize diagnosis and patient may benefit from palliative RT to prevent cord compression and maintain her current quality.  Daughter feels that patient's current condition has declined a lot recently due to dementia /Alzeimer's disease and desires no additional work up or treatment. She will discuss with  patient's primary care physician for hospice.   Total face to face encounter time for this patient visit was 40 min. >50% of the time was  spent in counseling and coordination of care.   Earlie Server, MD, PhD Hematology Oncology [facility] Pager- [phone] 11/02/2018 Multiple myeloma panel

## 2018-11-02 NOTE — Progress Notes (Signed)
Patient here for initial visit. Daughter, Janace Hoard, present with her. Per daughter, pt has alzheimer's. Patient has fracture to back and neck. Back brace was hurting patient so daughter contacted her PCP and he gave the ok to remove brace. Pt has a place on her back from the brace rubbing on it, but it has gotten better since it has been off.

## 2018-11-08 ENCOUNTER — Encounter: Payer: Self-pay | Admitting: Oncology

## 2018-11-08 DIAGNOSIS — Z7189 Other specified counseling: Secondary | ICD-10-CM | POA: Insufficient documentation

## 2018-11-23 DIAGNOSIS — G309 Alzheimer's disease, unspecified: Secondary | ICD-10-CM | POA: Diagnosis not present

## 2018-11-23 DIAGNOSIS — C9000 Multiple myeloma not having achieved remission: Secondary | ICD-10-CM | POA: Diagnosis not present

## 2018-11-23 DIAGNOSIS — F0280 Dementia in other diseases classified elsewhere without behavioral disturbance: Secondary | ICD-10-CM | POA: Diagnosis not present

## 2018-11-23 DIAGNOSIS — M8088XD Other osteoporosis with current pathological fracture, vertebra(e), subsequent encounter for fracture with routine healing: Secondary | ICD-10-CM | POA: Diagnosis not present

## 2019-01-19 DEATH — deceased

## 2019-02-10 IMAGING — MR MR TOTAL SPINE METS SCREENING
18 of 24 series · 32 of 48 positions shown · IV contrast (agent unspecified)
Comparison: CT cervical and lumbar spine 10/21/2018

ADDENDUM:
Findings were discussed with Parag Holmquist, PA
CLINICAL DATA: Cancer associated back pain.  Abnormal CT.

EXAM:
MRI TOTAL SPINE WITHOUT AND WITH CONTRAST
TECHNIQUE: Multisequence MR imaging of the spine from the cervical spine to the
sacrum was performed prior to and following IV contrast
administration for evaluation of spinal metastatic disease.
CONTRAST:  5 mL Gadovist IV

[Series 18: T1 · 2 of 8 slices shown (1 of 6)]
[im 1/8]
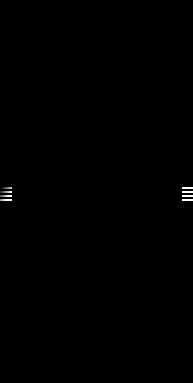
[im 8/8]
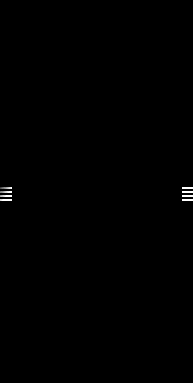

[Series 19: T2 · sagittal · 3.0mm · 1.33mm/px · 2 of 17 slices shown (1 of 6)]
[im 1/17]
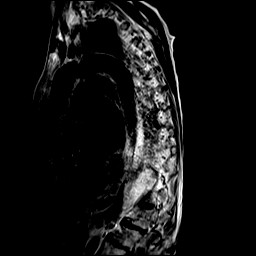
[im 17/17]
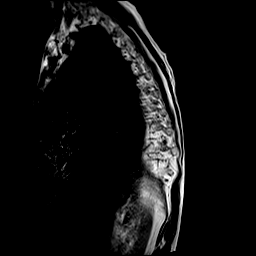

[Series 20: T1 · sagittal · 3.0mm · 1.33mm/px · 2 of 17 slices shown (2 of 6)]
[im 1/17]
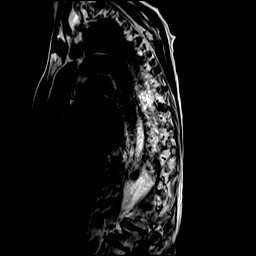
[im 17/17]
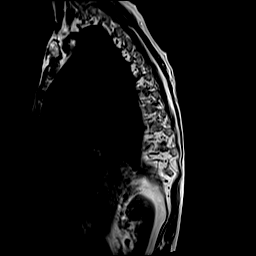

[Series 21: STIR · sagittal · 3.0mm · 0.66mm/px · 2 of 17 slices shown (1 of 3)]
[im 1/17]
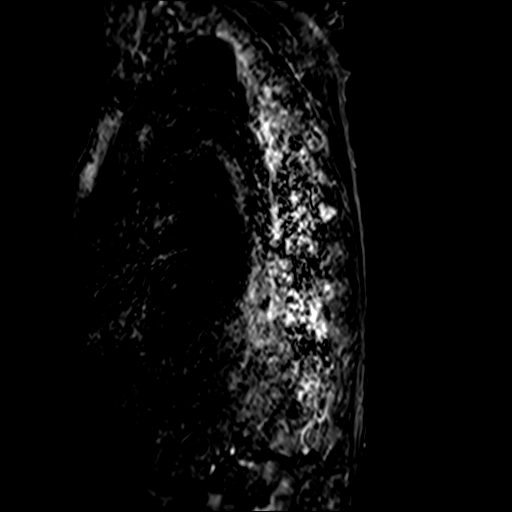
[im 17/17]
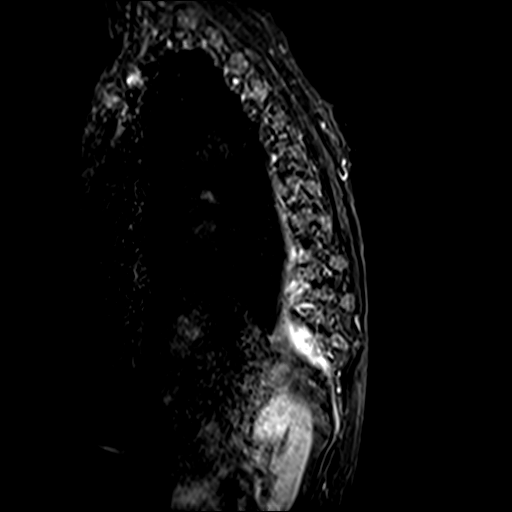

[Series 22: T2 · axial · 4.0mm · 0.59mm/px · z∈[-164,+27]mm · 4 of 39 slices shown (2 of 6)]
[im 1/39]
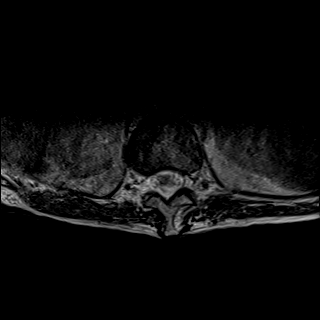
[im 13/39]
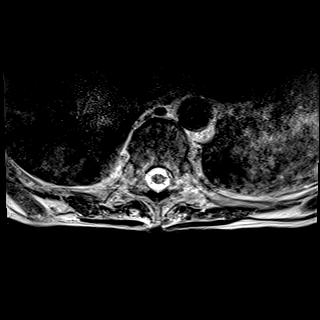
[im 26/39]
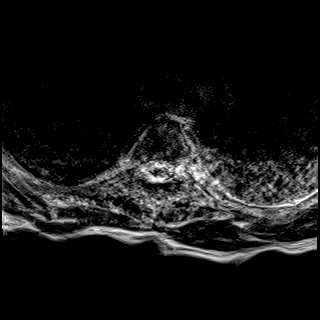
[im 39/39]
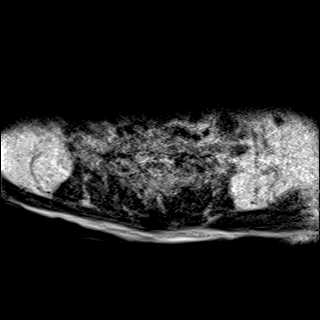

[Series 24: T1 post-contrast · axial · non-contrast · 4.0mm · 0.37mm/px · 1 of 39 slices shown]
[im 1/39]
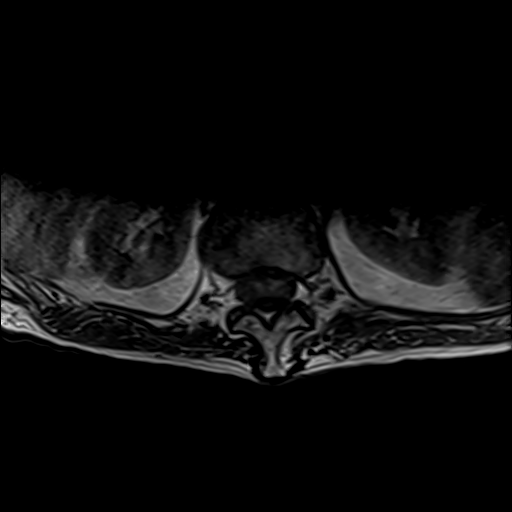

[Series 25: T2 · sagittal · 3.0mm · 0.62mm/px · 1 of 15 slices shown (3 of 6)]
[im 1/15]
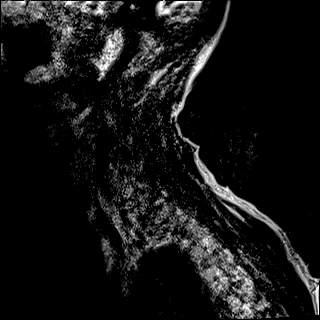

[Series 27: STIR · sagittal · 3.0mm · 0.62mm/px · 1 of 15 slices shown (2 of 3)]
[im 1/15]
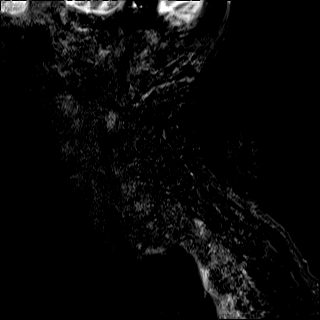

[Series 28: T2 · axial · 3.0mm · 0.70mm/px · z∈[+40,+134]mm · 2 of 24 slices shown (4 of 6)]
[im 1/24]
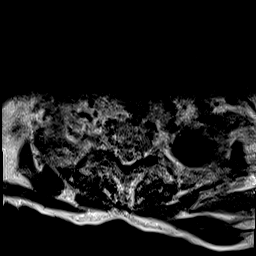
[im 24/24]
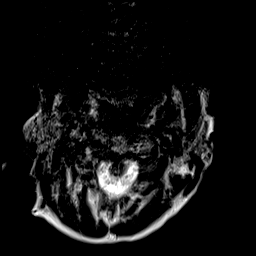

[Series 30: T1 · axial · non-contrast · 3.0mm · 0.35mm/px · z∈[+40,+134]mm · 2 of 28 slices shown (3 of 6)]
[im 1/28]
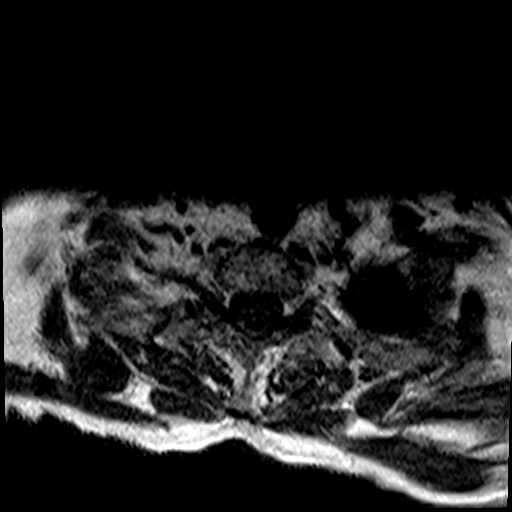
[im 28/28]
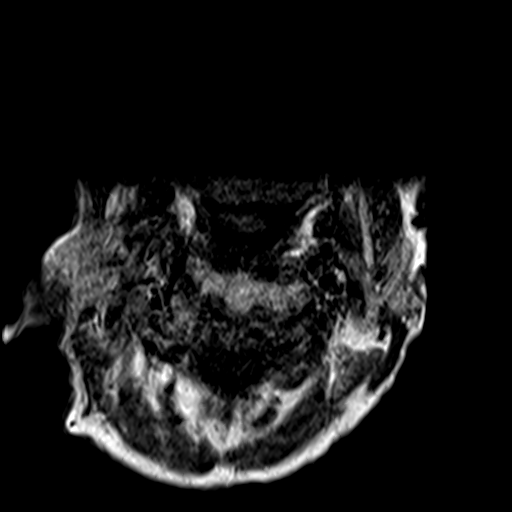

[Series 31: T2 · sagittal · 4.0mm · 1.02mm/px · 1 of 16 slices shown (5 of 6)]
[im 1/16]
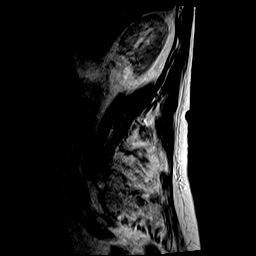

[Series 32: T1 · sagittal · 4.0mm · 1.02mm/px · 1 of 15 slices shown (4 of 6)]
[im 1/15]
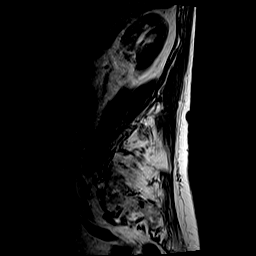

[Series 33: STIR · sagittal · 4.0mm · 0.51mm/px · 1 of 15 slices shown (3 of 3)]
[im 1/15]
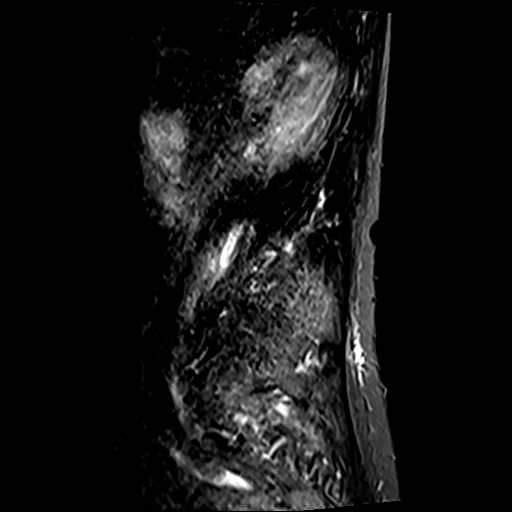

[Series 34: T2 · axial · 4.0mm · 0.78mm/px · z∈[-361,-168]mm · 3 of 36 slices shown (6 of 6)]
[im 1/36]
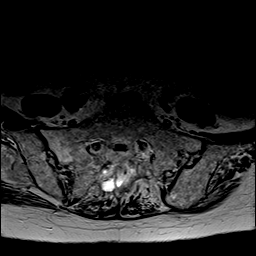
[im 18/36]
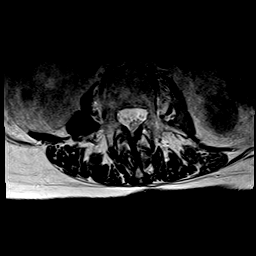
[im 36/36]
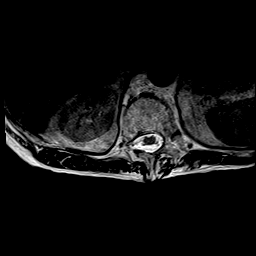

[Series 35: T1 · axial · 4.0mm · 0.39mm/px · z∈[-361,-168]mm · 3 of 36 slices shown (5 of 6)]
[im 1/36]
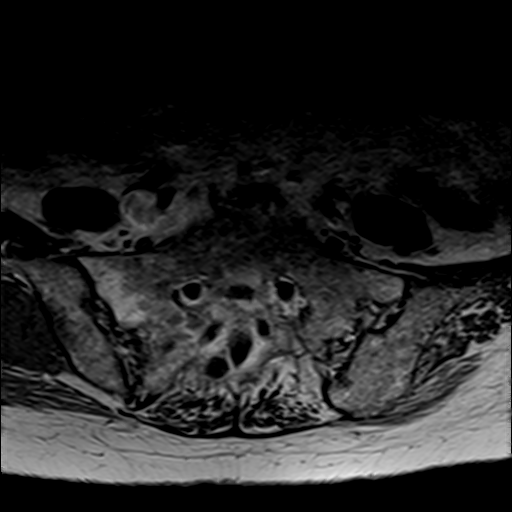
[im 18/36]
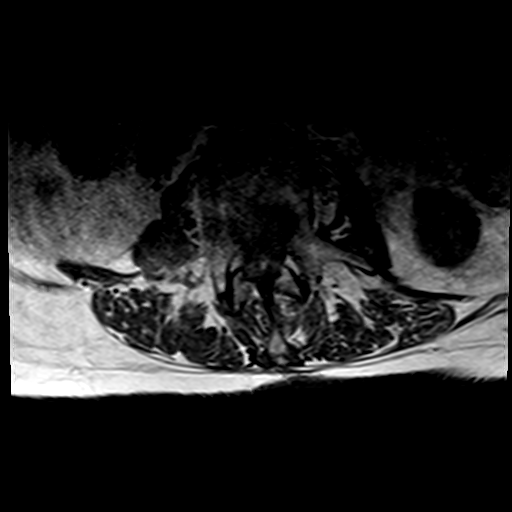
[im 36/36]
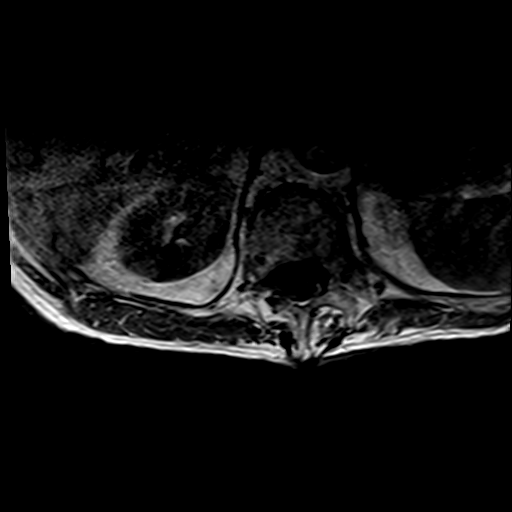

[Series 36: T1 · axial · non-contrast · 3.0mm · 0.35mm/px · z∈[+40,+134]mm · 2 of 27 slices shown (6 of 6)]
[im 1/27]
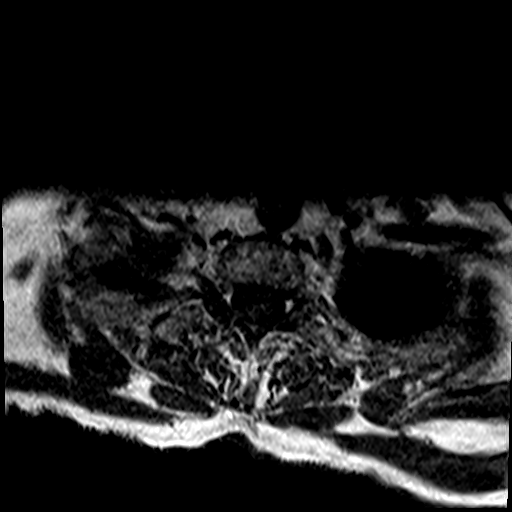
[im 27/27]
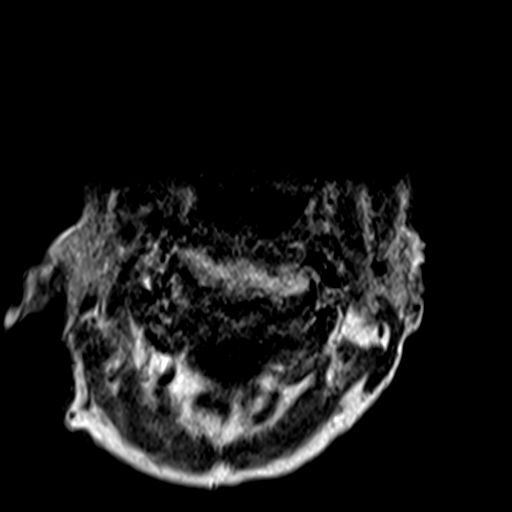

[Series 38: T1 fat-sat post-contrast · sagittal · 3.0mm · 1.33mm/px · 1 of 17 slices shown (1 of 2)]
[im 1/17]
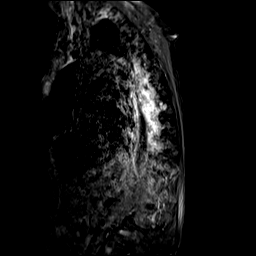

[Series 41: T1 fat-sat post-contrast · sagittal · 4.0mm · 1.02mm/px · 1 of 15 slices shown (2 of 2)]
[im 1/15]
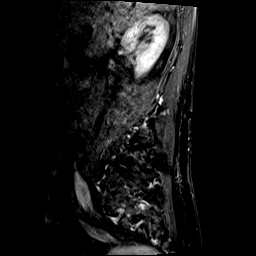

[32 of 48 positions shown; findings below may reference images not displayed]

FINDINGS: MRI CERVICAL SPINE FINDINGS

Image quality degraded by extensive artifact. Unenhanced images are
essentially nondiagnostic. Multiple bony lesions are best seen on
the prior CT. Postcontrast images are slightly better quality and
reveal enhancing mass lesion in the C2 vertebral body as well as the
left C1 lateral mass. Small enhancing mass in the T1 vertebral body
and T2 vertebral body. No epidural tumor or cord compression.

MRI THORACIC SPINE FINDINGS

Image quality degraded by extensive motion. No thoracic fracture is
identified. Moderately large right-sided disc protrusion at T3-4

Enhancing tumor involving the spinous process of T11 extending into
the posterior epidural space. Axial images demonstrate cord
flattening and mild cord compression. Patchy enhancement T7, T8, T9,
and T10, and T11 vertebral bodies. Small enhancing lesion T6
vertebral body. These are subtle lesions but given the other
findings are most likely due to tumor, probably myeloma.

Small left pleural effusion

MRI LUMBAR SPINE FINDINGS

Image quality degraded by moderate motion. Negative for lumbar
fracture. Patchy enhancement L1 and L2 vertebral bodies likely due
to tumor. No epidural tumor in the lumbar canal.

Degenerative retrolisthesis L1-2 and L2-3 with disc degeneration and
spurring.
IMPRESSION: 1. MRI of the cervical, thoracic, and lumbar spine is significantly
degraded by motion. There are multiple areas of enhancing bone
marrow most likely due to multiple myeloma. This is better seen on
the CT scan of the cervical and lumbar spine.
2. Tumor involving the spinous process of T11 extending in the post
epidural space. There is cord flattening with mild cord compression.

## 2019-02-10 IMAGING — CT CT L SPINE W/O CM
3 of 4 series · 12 of 35 positions shown, 14 images · non-contrast
Comparison: CT of the abdomen and pelvis 09/15/2018

CLINICAL DATA: Unwitnessed fall. Unable to stand. Back pain, worse
than usual.

EXAM:
CT LUMBAR SPINE WITHOUT CONTRAST
TECHNIQUE: Multidetector CT imaging of the lumbar spine was performed without
intravenous contrast administration. Multiplanar CT image
reconstructions were also generated.

[Series 5: sagittal bone · sagittal · 0.33mm/px · 5 of 108 slices shown, 6 images]
[im 36/108  bone]
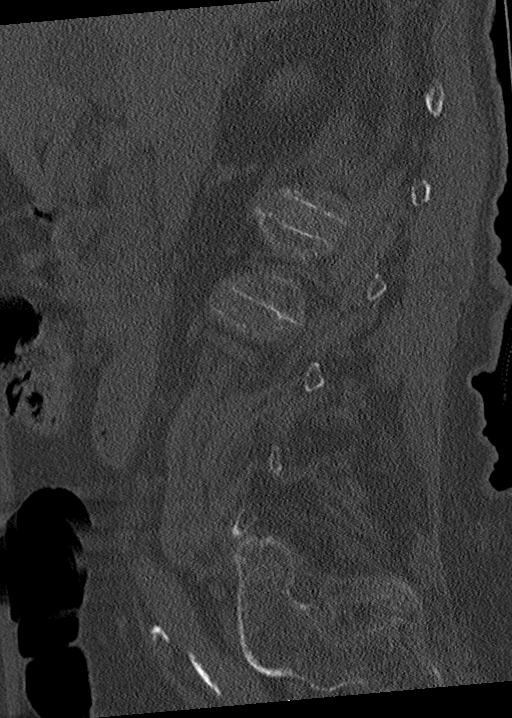
[im 45/108  bone]
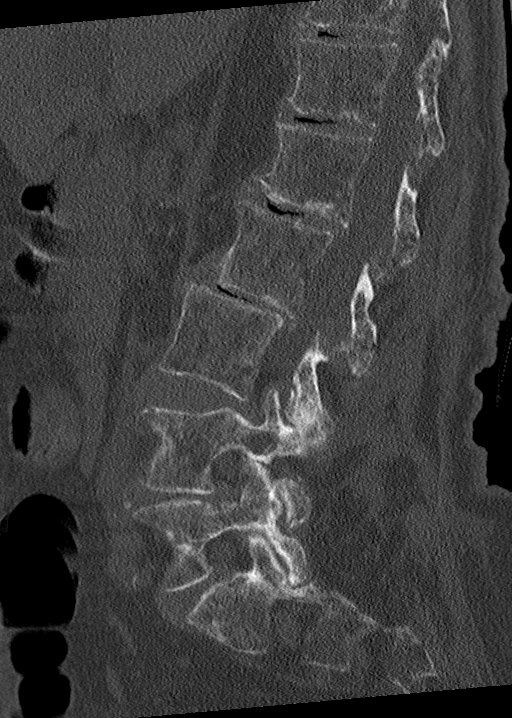
[im 54/108  soft-tissue]
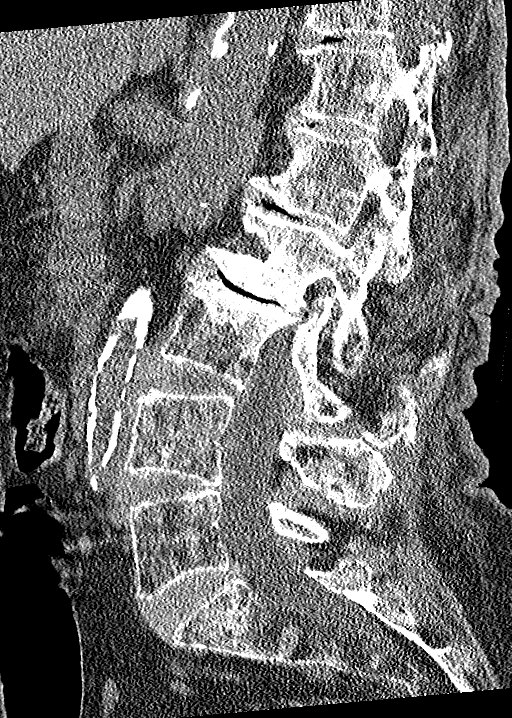
[im 54/108  bone]
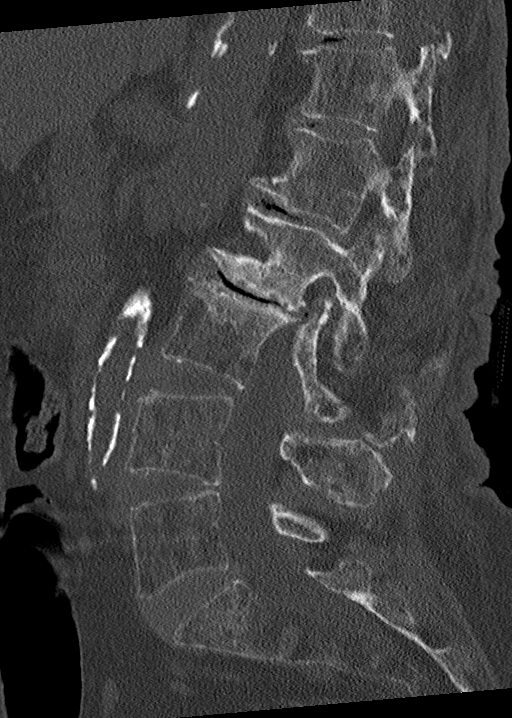
[im 63/108  bone]
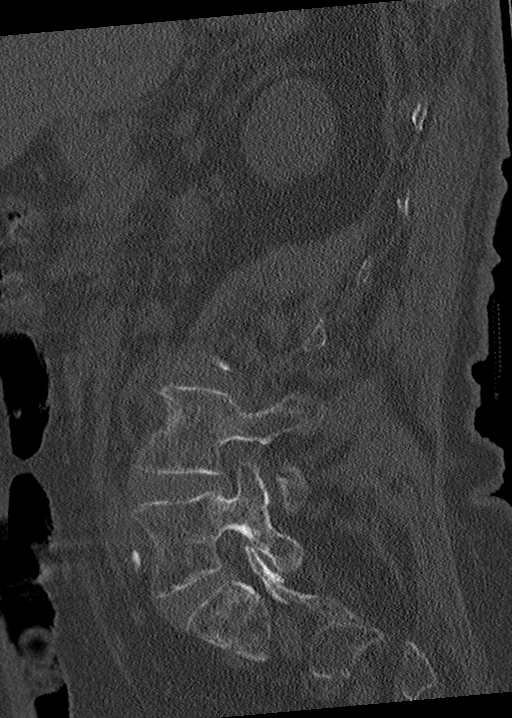
[im 72/108  bone]
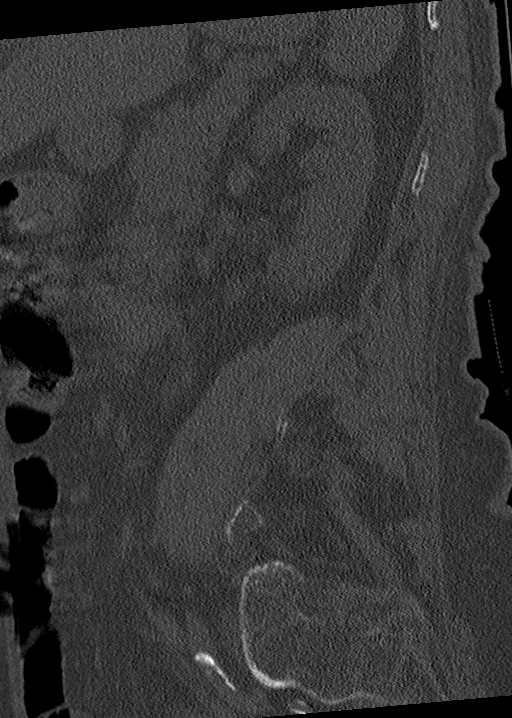

[Series 6: coronal bone · coronal · 0.46mm/px · 3 of 88 slices shown]
[im 18/88  bone]
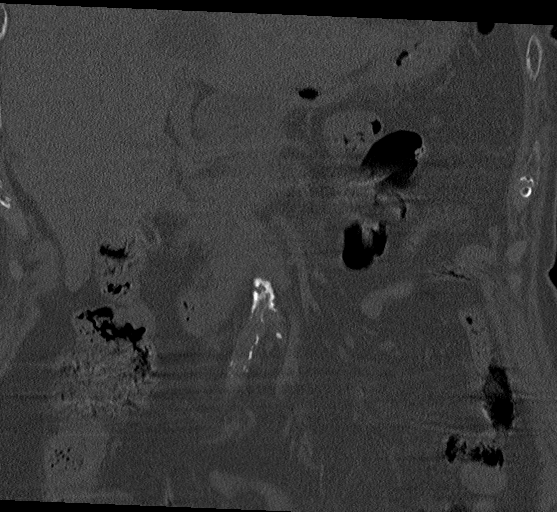
[im 35/88  bone]
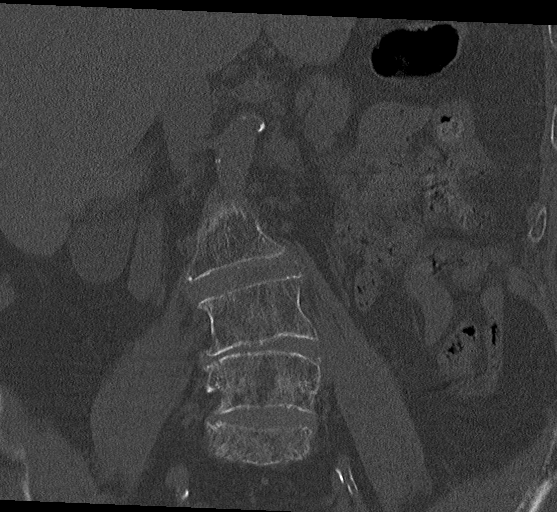
[im 53/88  bone]
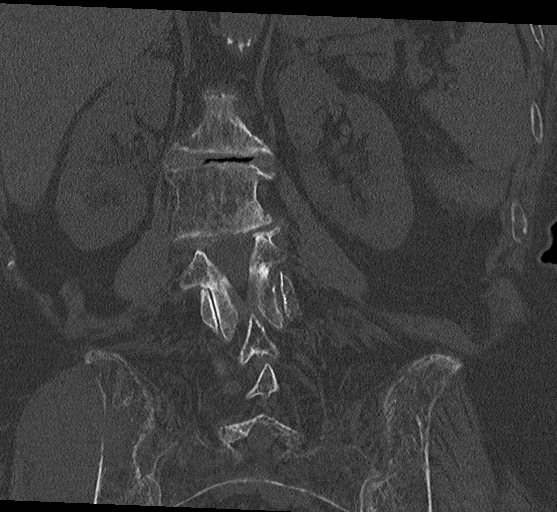

[Series 7: multi disc · axial · 0.35mm/px · z∈[-542,-356]mm · 4 of 135 slices shown, 5 images]
[im 20/135  soft-tissue]
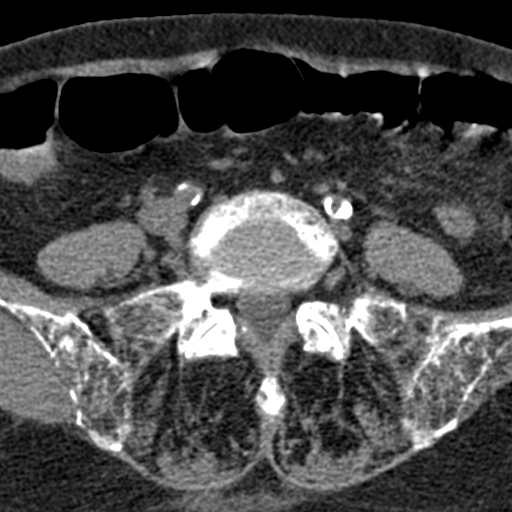
[im 20/135  bone]
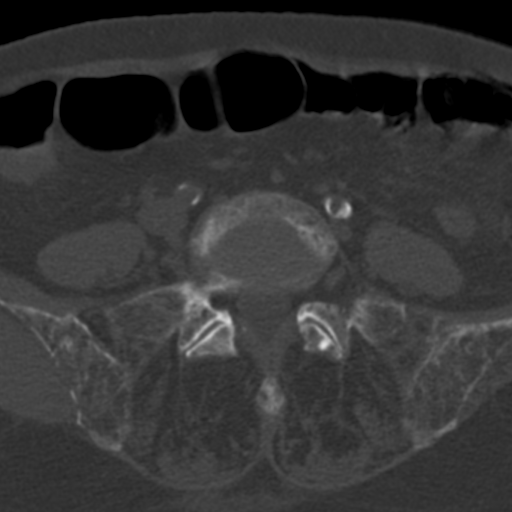
[im 58/135  bone]
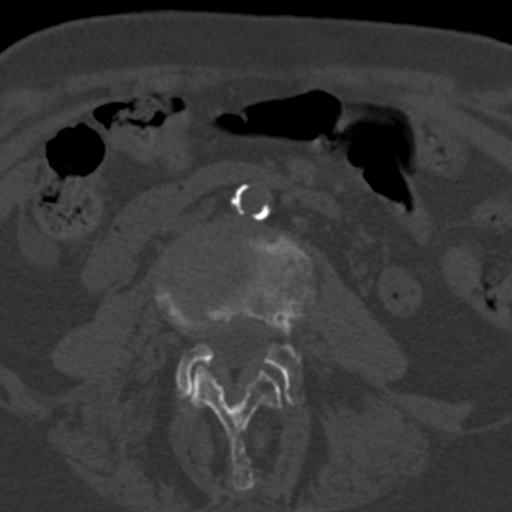
[im 77/135  bone]
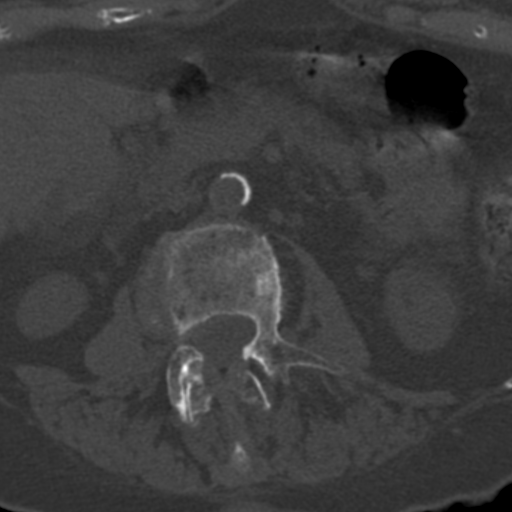
[im 115/135  bone]
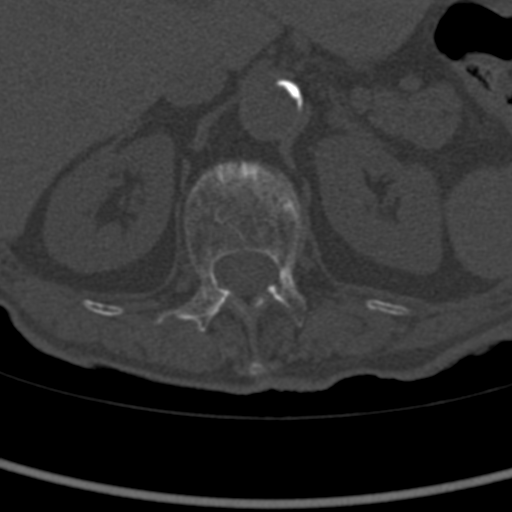

[12 of 35 positions shown; findings below may reference images not displayed]

FINDINGS: Segmentation: 5 fully formed vertebral bodies are present. The
lowest fully formed vertebral body is L5

Alignment: Slight retrolisthesis is present at L1-2 and L2-3,
stable. Rightward curvature is centered at L1-2.

Vertebrae: Moderate osteopenia is present. There is a diffuse
heterogeneous pattern of the marrow. A destructive lesion is present
in the posterior elements on the left at T11. This is only partially
imaged. Tumor extends into the spinal canal. Tumor likely extends
into the left T10-11 foramen.

Endplate sclerotic changes are present on the left at L2-3 greater
than L3-4.

No other definite destructive lytic lesions are present in the
lumbar spine. There is a lesion extending into the right iliac bone.
Soft tissue tumor extends laterally into the gluteus musculature.

Other lesions are suspected within the heterogeneity.

Paraspinal and other soft tissues: Atherosclerotic calcifications
are present in the aorta without aneurysm. A stable cyst is present
in the left lobe of the liver. Solid organs are otherwise
unremarkable. No significant adenopathy is present.

Disc levels: T12-L1: No significant stenosis.

L1-2: Asymmetric left-sided facet hypertrophy is present. Mild left
foraminal narrowing is present uncovering of the disc contributes.

L2-3: There is uncovering of a broad-based disc protrusion. Moderate
facet hypertrophy contributes. Mild foraminal narrowing is worse on
the left.

L3-4: A broad-based disc protrusion is present. Moderate facet
hypertrophy and ligamentum flavum thickening is noted. Central canal
is patent. Mild foraminal narrowing is evident bilaterally.

L4-5: A broad-based disc protrusion is present. Moderate facet
hypertrophy and ligamentum flavum thickening contribute to mild
central canal stenosis with subarticular narrowing bilaterally. Mild
foraminal narrowing is also present bilaterally.

L5-S1: Mild disc bulging is present. There is mild facet hypertrophy
bilaterally. No significant stenosis is present.
IMPRESSION: 1. Lytic soft tissue tumor involving the posterior elements on the
left at T11 invasion into the spinal canal. Margin of tumor cannot
be clearly identified on this noncontrast CT. Recommend MRI
cervical, thoracic, and lumbar spine without and with contrast
further evaluation of metastatic disease. No definite primary is
evident.
2. Soft tissue tumor with lysis of the right iliac bone extends into
the right gluteal musculature.
3. Diffuse heterogeneous marrow signal likely represents osteopenia.
Other small metastatic lesions are not excluded.
4. Mild foraminal narrowing at L1-2, L2-3, and L3-4.
5. Mild central canal and foraminal narrowing bilaterally at L4-5.

These results were called by telephone at the time of interpretation
on 10/21/2018 at [DATE] to ASHRAFUL ALOM LEAKAT , who verbally acknowledged
these results.
